# Patient Record
Sex: Female | Born: 1955 | Race: White | Hispanic: No | Marital: Single | Smoking: Never smoker
Health system: Southern US, Community
[De-identification: ages and names within clinical notes are randomized; demographics above are authoritative.]

## PROBLEM LIST (undated history)

## (undated) DIAGNOSIS — F419 Anxiety disorder, unspecified: Secondary | ICD-10-CM

## (undated) DIAGNOSIS — I447 Left bundle-branch block, unspecified: Secondary | ICD-10-CM

## (undated) DIAGNOSIS — I10 Essential (primary) hypertension: Secondary | ICD-10-CM

## (undated) DIAGNOSIS — K219 Gastro-esophageal reflux disease without esophagitis: Secondary | ICD-10-CM

## (undated) DIAGNOSIS — F32A Depression, unspecified: Secondary | ICD-10-CM

## (undated) DIAGNOSIS — C50919 Malignant neoplasm of unspecified site of unspecified female breast: Secondary | ICD-10-CM

## (undated) DIAGNOSIS — F329 Major depressive disorder, single episode, unspecified: Secondary | ICD-10-CM

## (undated) DIAGNOSIS — E039 Hypothyroidism, unspecified: Secondary | ICD-10-CM

## (undated) HISTORY — PX: MASTECTOMY: SHX3

## (undated) HISTORY — PX: SEPTOPLASTY: SUR1290

## (undated) HISTORY — PX: AUGMENTATION MAMMAPLASTY: SUR837

## (undated) HISTORY — PX: NASAL SINUS SURGERY: SHX719

## (undated) HISTORY — DX: Hypothyroidism, unspecified: E03.9

## (undated) HISTORY — DX: Essential (primary) hypertension: I10

---
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
## 1984-09-15 HISTORY — PX: VEIN SURGERY: SHX48

## 2004-12-20 ENCOUNTER — Ambulatory Visit: Payer: Self-pay | Admitting: Vascular Surgery

## 2004-12-26 ENCOUNTER — Ambulatory Visit: Payer: Self-pay | Admitting: Vascular Surgery

## 2005-10-27 ENCOUNTER — Ambulatory Visit: Payer: Self-pay | Admitting: Internal Medicine

## 2006-05-27 ENCOUNTER — Ambulatory Visit: Payer: Self-pay | Admitting: Internal Medicine

## 2006-07-10 ENCOUNTER — Ambulatory Visit: Payer: Self-pay | Admitting: Internal Medicine

## 2007-01-26 ENCOUNTER — Ambulatory Visit: Payer: Self-pay | Admitting: Internal Medicine

## 2007-04-06 ENCOUNTER — Ambulatory Visit: Payer: Self-pay | Admitting: Emergency Medicine

## 2007-04-14 ENCOUNTER — Ambulatory Visit: Payer: Self-pay | Admitting: Internal Medicine

## 2007-07-20 ENCOUNTER — Ambulatory Visit: Payer: Self-pay | Admitting: Internal Medicine

## 2007-10-27 ENCOUNTER — Ambulatory Visit: Payer: Self-pay | Admitting: Otolaryngology

## 2008-03-16 ENCOUNTER — Ambulatory Visit: Payer: Self-pay | Admitting: Internal Medicine

## 2008-05-23 ENCOUNTER — Ambulatory Visit: Payer: Self-pay | Admitting: Family Medicine

## 2008-06-02 ENCOUNTER — Ambulatory Visit: Payer: Self-pay

## 2009-05-16 ENCOUNTER — Ambulatory Visit: Payer: Self-pay | Admitting: Internal Medicine

## 2009-05-25 ENCOUNTER — Other Ambulatory Visit: Payer: Self-pay | Admitting: Internal Medicine

## 2010-04-10 ENCOUNTER — Ambulatory Visit: Payer: Self-pay | Admitting: Otolaryngology

## 2010-04-18 ENCOUNTER — Ambulatory Visit: Payer: Self-pay | Admitting: Otolaryngology

## 2010-08-22 ENCOUNTER — Encounter: Payer: Self-pay | Admitting: General Practice

## 2010-08-22 ENCOUNTER — Emergency Department: Payer: Self-pay | Admitting: Emergency Medicine

## 2010-08-23 ENCOUNTER — Ambulatory Visit: Payer: Self-pay | Admitting: Physician Assistant

## 2010-09-15 ENCOUNTER — Encounter: Payer: Self-pay | Admitting: General Practice

## 2010-10-14 ENCOUNTER — Ambulatory Visit: Payer: Self-pay | Admitting: General Practice

## 2010-10-18 ENCOUNTER — Other Ambulatory Visit: Payer: Self-pay | Admitting: Internal Medicine

## 2011-07-24 ENCOUNTER — Encounter: Payer: Self-pay | Admitting: Orthopedic Surgery

## 2011-08-16 ENCOUNTER — Encounter: Payer: Self-pay | Admitting: Orthopedic Surgery

## 2011-09-16 HISTORY — PX: COLONOSCOPY: SHX174

## 2011-09-17 ENCOUNTER — Ambulatory Visit: Payer: Self-pay | Admitting: Internal Medicine

## 2011-12-08 ENCOUNTER — Other Ambulatory Visit: Payer: Self-pay | Admitting: Internal Medicine

## 2011-12-08 LAB — LIPASE, BLOOD: Lipase: 413 U/L — ABNORMAL HIGH (ref 73–393)

## 2011-12-08 LAB — TSH: Thyroid Stimulating Horm: 7.91 u[IU]/mL — ABNORMAL HIGH

## 2011-12-26 ENCOUNTER — Ambulatory Visit: Payer: Self-pay | Admitting: Unknown Physician Specialty

## 2011-12-31 ENCOUNTER — Other Ambulatory Visit: Payer: Self-pay | Admitting: Unknown Physician Specialty

## 2012-08-04 ENCOUNTER — Ambulatory Visit: Payer: Self-pay | Admitting: General Practice

## 2013-02-16 ENCOUNTER — Ambulatory Visit: Payer: Self-pay | Admitting: Physician Assistant

## 2013-04-29 ENCOUNTER — Ambulatory Visit: Payer: Self-pay | Admitting: General Practice

## 2013-10-07 ENCOUNTER — Ambulatory Visit (INDEPENDENT_AMBULATORY_CARE_PROVIDER_SITE_OTHER): Payer: 59

## 2013-10-07 ENCOUNTER — Ambulatory Visit (INDEPENDENT_AMBULATORY_CARE_PROVIDER_SITE_OTHER): Payer: 59 | Admitting: Podiatry

## 2013-10-07 ENCOUNTER — Encounter: Payer: Self-pay | Admitting: Podiatry

## 2013-10-07 VITALS — BP 91/60 | HR 61 | Resp 16 | Ht 68.0 in | Wt 180.0 lb

## 2013-10-07 DIAGNOSIS — M2020 Hallux rigidus, unspecified foot: Secondary | ICD-10-CM

## 2013-10-07 DIAGNOSIS — M722 Plantar fascial fibromatosis: Secondary | ICD-10-CM

## 2013-10-07 MED ORDER — TRIAMCINOLONE ACETONIDE 10 MG/ML IJ SUSP
10.0000 mg | Freq: Once | INTRAMUSCULAR | Status: AC
  Administered 2013-10-07: 10 mg

## 2013-10-07 MED ORDER — DICLOFENAC SODIUM 75 MG PO TBEC: 75.0000 mg | DELAYED_RELEASE_TABLET | Freq: Two times a day (BID) | ORAL | Status: DC

## 2013-10-07 NOTE — Progress Notes (Signed)
   Subjective:    Patient ID: Margaret Farmer, female    DOB: 1955/12/29, 58 y.o.   MRN: [id number]  HPI Comments: Having severe heel pain in the left heel also numbness in the lateral side of foot to the ball of foot, cramps in the foot , pain that goes up to the back of the calf, its been going on for weeks, saw orthapedic and he put me on a anti inflammatory, it has not helped   Foot Pain      Review of Systems  All other systems reviewed and are negative.       Objective:   Physical Exam        Assessment & Plan:

## 2013-10-07 NOTE — Patient Instructions (Signed)
Plantar Fasciitis (Heel Spur Syndrome) with Rehab The plantar fascia is a fibrous, ligament-like, soft-tissue structure that spans the bottom of the foot. Plantar fasciitis is a condition that causes pain in the foot due to inflammation of the tissue. SYMPTOMS   Pain and tenderness on the underneath side of the foot.  Pain that worsens with standing or walking. CAUSES  Plantar fasciitis is caused by irritation and injury to the plantar fascia on the underneath side of the foot. Common mechanisms of injury include:  Direct trauma to bottom of the foot.  Damage to a small nerve that runs under the foot where the main fascia attaches to the heel bone.  Stress placed on the plantar fascia due to bone spurs. RISK INCREASES WITH:   Activities that place stress on the plantar fascia (running, jumping, pivoting, or cutting).  Poor strength and flexibility.  Improperly fitted shoes.  Tight calf muscles.  Flat feet.  Failure to warm-up properly before activity.  Obesity. PREVENTION  Warm up and stretch properly before activity.  Allow for adequate recovery between workouts.  Maintain physical fitness:  Strength, flexibility, and endurance.  Cardiovascular fitness.  Maintain a health body weight.  Avoid stress on the plantar fascia.  Wear properly fitted shoes, including arch supports for individuals who have flat feet. PROGNOSIS  If treated properly, then the symptoms of plantar fasciitis usually resolve without surgery. However, occasionally surgery is necessary. RELATED COMPLICATIONS   Recurrent symptoms that may result in a chronic condition.  Problems of the lower back that are caused by compensating for the injury, such as limping.  Pain or weakness of the foot during push-off following surgery.  Chronic inflammation, scarring, and partial or complete fascia tear, occurring more often from repeated injections. TREATMENT  Treatment initially involves the use of  ice and medication to help reduce pain and inflammation. The use of strengthening and stretching exercises may help reduce pain with activity, especially stretches of the Achilles tendon. These exercises may be performed at home or with a therapist. Your caregiver may recommend that you use heel cups of arch supports to help reduce stress on the plantar fascia. Occasionally, corticosteroid injections are given to reduce inflammation. If symptoms persist for greater than 6 months despite non-surgical (conservative), then surgery may be recommended.  MEDICATION   If pain medication is necessary, then nonsteroidal anti-inflammatory medications, such as aspirin and ibuprofen, or other minor pain relievers, such as acetaminophen, are often recommended.  Do not take pain medication within 7 days before surgery.  Prescription pain relievers may be given if deemed necessary by your caregiver. Use only as directed and only as much as you need.  Corticosteroid injections may be given by your caregiver. These injections should be reserved for the most serious cases, because they may only be given a certain number of times. HEAT AND COLD  Cold treatment (icing) relieves pain and reduces inflammation. Cold treatment should be applied for 10 to 15 minutes every 2 to 3 hours for inflammation and pain and immediately after any activity that aggravates your symptoms. Use ice packs or massage the area with a piece of ice (ice massage).  Heat treatment may be used prior to performing the stretching and strengthening activities prescribed by your caregiver, physical therapist, or athletic trainer. Use a heat pack or soak the injury in warm water. SEEK IMMEDIATE MEDICAL CARE IF:  Treatment seems to offer no benefit, or the condition worsens.  Any medications produce adverse side effects. EXERCISES RANGE   OF MOTION (ROM) AND STRETCHING EXERCISES - Plantar Fasciitis (Heel Spur Syndrome) These exercises may help you  when beginning to rehabilitate your injury. Your symptoms may resolve with or without further involvement from your physician, physical therapist or athletic trainer. While completing these exercises, remember:   Restoring tissue flexibility helps normal motion to return to the joints. This allows healthier, less painful movement and activity.  An effective stretch should be held for at least 30 seconds.  A stretch should never be painful. You should only feel a gentle lengthening or release in the stretched tissue. RANGE OF MOTION - Toe Extension, Flexion  Sit with your right / left leg crossed over your opposite knee.  Grasp your toes and gently pull them back toward the top of your foot. You should feel a stretch on the bottom of your toes and/or foot.  Hold this stretch for __________ seconds.  Now, gently pull your toes toward the bottom of your foot. You should feel a stretch on the top of your toes and or foot.  Hold this stretch for __________ seconds. Repeat __________ times. Complete this stretch __________ times per day.  RANGE OF MOTION - Ankle Dorsiflexion, Active Assisted  Remove shoes and sit on a chair that is preferably not on a carpeted surface.  Place right / left foot under knee. Extend your opposite leg for support.  Keeping your heel down, slide your right / left foot back toward the chair until you feel a stretch at your ankle or calf. If you do not feel a stretch, slide your bottom forward to the edge of the chair, while still keeping your heel down.  Hold this stretch for __________ seconds. Repeat __________ times. Complete this stretch __________ times per day.  STRETCH  Gastroc, Standing  Place hands on wall.  Extend right / left leg, keeping the front knee somewhat bent.  Slightly point your toes inward on your back foot.  Keeping your right / left heel on the floor and your knee straight, shift your weight toward the wall, not allowing your back to  arch.  You should feel a gentle stretch in the right / left calf. Hold this position for __________ seconds. Repeat __________ times. Complete this stretch __________ times per day. STRETCH  Soleus, Standing  Place hands on wall.  Extend right / left leg, keeping the other knee somewhat bent.  Slightly point your toes inward on your back foot.  Keep your right / left heel on the floor, bend your back knee, and slightly shift your weight over the back leg so that you feel a gentle stretch deep in your back calf.  Hold this position for __________ seconds. Repeat __________ times. Complete this stretch __________ times per day. STRETCH  Gastrocsoleus, Standing  Note: This exercise can place a lot of stress on your foot and ankle. Please complete this exercise only if specifically instructed by your caregiver.   Place the ball of your right / left foot on a step, keeping your other foot firmly on the same step.  Hold on to the wall or a rail for balance.  Slowly lift your other foot, allowing your body weight to press your heel down over the edge of the step.  You should feel a stretch in your right / left calf.  Hold this position for __________ seconds.  Repeat this exercise with a slight bend in your right / left knee. Repeat __________ times. Complete this stretch __________ times per day.    STRENGTHENING EXERCISES - Plantar Fasciitis (Heel Spur Syndrome)  These exercises may help you when beginning to rehabilitate your injury. They may resolve your symptoms with or without further involvement from your physician, physical therapist or athletic trainer. While completing these exercises, remember:   Muscles can gain both the endurance and the strength needed for everyday activities through controlled exercises.  Complete these exercises as instructed by your physician, physical therapist or athletic trainer. Progress the resistance and repetitions only as guided. STRENGTH - Towel  Curls  Sit in a chair positioned on a non-carpeted surface.  Place your foot on a towel, keeping your heel on the floor.  Pull the towel toward your heel by only curling your toes. Keep your heel on the floor.  If instructed by your physician, physical therapist or athletic trainer, add ____________________ at the end of the towel. Repeat __________ times. Complete this exercise __________ times per day. STRENGTH - Ankle Inversion  Secure one end of a rubber exercise band/tubing to a fixed object (table, pole). Loop the other end around your foot just before your toes.  Place your fists between your knees. This will focus your strengthening at your ankle.  Slowly, pull your big toe up and in, making sure the band/tubing is positioned to resist the entire motion.  Hold this position for __________ seconds.  Have your muscles resist the band/tubing as it slowly pulls your foot back to the starting position. Repeat __________ times. Complete this exercises __________ times per day.  Document Released: 09/01/2005 Document Revised: 11/24/2011 Document Reviewed: 12/14/2008 [employer] Patient Information 2014 [employer]. Plantar Fasciitis Plantar fasciitis is a common condition that causes foot pain. It is soreness (inflammation) of the band of tough fibrous tissue on the bottom of the foot that runs from the heel bone (calcaneus) to the ball of the foot. The cause of this soreness may be from excessive standing, poor fitting shoes, running on hard surfaces, being overweight, having an abnormal walk, or overuse (this is common in runners) of the painful foot or feet. It is also common in aerobic exercise dancers and ballet dancers. SYMPTOMS  Most people with plantar fasciitis complain of:  Severe pain in the morning on the bottom of their foot especially when taking the first steps out of bed. This pain recedes after a few minutes of walking.  Severe pain is experienced also during walking  following a long period of inactivity.  Pain is worse when walking barefoot or up stairs DIAGNOSIS   Your caregiver will diagnose this condition by examining and feeling your foot.  Special tests such as X-rays of your foot, are usually not needed. PREVENTION   Consult a sports medicine professional before beginning a new exercise program.  Walking programs offer a good workout. With walking there is a lower chance of overuse injuries common to runners. There is less impact and less jarring of the joints.  Begin all new exercise programs slowly. If problems or pain develop, decrease the amount of time or distance until you are at a comfortable level.  Wear good shoes and replace them regularly.  Stretch your foot and the heel cords at the back of the ankle (Achilles tendon) both before and after exercise.  Run or exercise on even surfaces that are not hard. For example, asphalt is better than pavement.  Do not run barefoot on hard surfaces.  If using a treadmill, vary the incline.  Do not continue to workout if you have foot or joint   problems. Seek professional help if they do not improve. HOME CARE INSTRUCTIONS   Avoid activities that cause you pain until you recover.  Use ice or cold packs on the problem or painful areas after working out.  Only take over-the-counter or prescription medicines for pain, discomfort, or fever as directed by your caregiver.  Soft shoe inserts or athletic shoes with air or gel sole cushions may be helpful.  If problems continue or become more severe, consult a sports medicine caregiver or your own health care provider. Cortisone is a potent anti-inflammatory medication that may be injected into the painful area. You can discuss this treatment with your caregiver. MAKE SURE YOU:   Understand these instructions.  Will watch your condition.  Will get help right away if you are not doing well or get worse. Document Released: 05/27/2001 Document  Revised: 11/24/2011 Document Reviewed: 07/26/2008 [employer] Patient Information 2014 [employer].  

## 2013-10-07 NOTE — Progress Notes (Signed)
Subjective:     Patient ID: Margaret Farmer, female   DOB: [birth year], 58 y.o.   MRN: [id number]  Foot Pain   patient presents stating she's been having pain in her heel for several months it has worsened over the last month and it now hurts in the arch the back of the leg and the forefoot   Review of Systems  All other systems reviewed and are negative.       Objective:   Physical Exam  Nursing note and vitals reviewed. Constitutional: She is oriented to person, place, and time.  Cardiovascular: Intact distal pulses.   Musculoskeletal: Normal range of motion.  Neurological: She is oriented to person, place, and time.  Skin: Skin is warm.   neurovascular status intact with no equinus condition noted and muscle strength adequate. Patient is found to have severe discomfort on the plantar heel left at the insertional point of the tendon into the calcaneus with inflammation and fluid around the medial pain     Assessment:     Severe plantar fasciitis with inflammation around the insertion into the calcaneus with compensation in other areas    Plan:     H&P and x-ray reviewed with patient and injected the left plantar fascia 3 mg Kenalog 5 of Xylocaine Marcaine mixture and dispensed fascially brace. placed on Voltaren 75 mg twice a day and discussed long-term orthotics for this patient

## 2013-11-01 ENCOUNTER — Ambulatory Visit (INDEPENDENT_AMBULATORY_CARE_PROVIDER_SITE_OTHER): Payer: 59 | Admitting: Podiatry

## 2013-11-01 ENCOUNTER — Encounter: Payer: Self-pay | Admitting: Podiatry

## 2013-11-01 VITALS — BP 131/83 | HR 95 | Resp 16

## 2013-11-01 DIAGNOSIS — M722 Plantar fascial fibromatosis: Secondary | ICD-10-CM

## 2013-11-01 DIAGNOSIS — M21619 Bunion of unspecified foot: Secondary | ICD-10-CM

## 2013-11-01 NOTE — Progress Notes (Signed)
Left heel maybe 85% better

## 2013-11-01 NOTE — Progress Notes (Signed)
Subjective:     Patient ID: Margaret Farmer, female   DOB: [birth year], 58 y.o.   MRN: [id number]  HPI patient states her left heel is quite a bit better but hurts after she has worked for approximately 6 hours   Review of Systems     Objective:   Physical Exam Neurovascular status unchanged well oriented x3 with mild discomfort to pressure left heel at the insertion of the tendon into the calcaneus    Assessment:     Plan her fasciitis heel region left which is improving but still sore with activity and weightbearing    Plan:     Advised this patient on continued stretching exercises and scanned for custom orthotics to reduce stress against the feet reappoint when orthotics returned

## 2013-11-04 ENCOUNTER — Ambulatory Visit: Payer: Self-pay | Admitting: Internal Medicine

## 2013-11-16 ENCOUNTER — Encounter: Payer: Self-pay | Admitting: *Deleted

## 2013-11-16 NOTE — Progress Notes (Signed)
Sent postcard to let pt know her orthotics are here.

## 2013-11-22 ENCOUNTER — Ambulatory Visit (INDEPENDENT_AMBULATORY_CARE_PROVIDER_SITE_OTHER): Payer: 59 | Admitting: Podiatry

## 2013-11-22 VITALS — BP 125/96 | HR 103 | Resp 16 | Ht 68.0 in | Wt 180.0 lb

## 2013-11-22 DIAGNOSIS — M722 Plantar fascial fibromatosis: Secondary | ICD-10-CM

## 2013-11-22 NOTE — Patient Instructions (Signed)

## 2013-11-22 NOTE — Progress Notes (Signed)
Subjective:     Patient ID: Margaret Farmer, female   DOB: 06/02/1956, 58 y.o.   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 Reappoint to recheck again in 4 weeks

## 2013-12-16 ENCOUNTER — Ambulatory Visit: Payer: Self-pay | Admitting: Internal Medicine

## 2013-12-20 ENCOUNTER — Ambulatory Visit: Payer: 59 | Admitting: Podiatry

## 2014-04-12 ENCOUNTER — Ambulatory Visit: Payer: Self-pay | Admitting: Surgery

## 2014-12-20 ENCOUNTER — Encounter: Payer: Self-pay | Admitting: *Deleted

## 2015-10-15 DIAGNOSIS — I10 Essential (primary) hypertension: Secondary | ICD-10-CM | POA: Diagnosis not present

## 2015-10-15 DIAGNOSIS — J3089 Other allergic rhinitis: Secondary | ICD-10-CM | POA: Diagnosis not present

## 2015-10-15 DIAGNOSIS — Z87898 Personal history of other specified conditions: Secondary | ICD-10-CM | POA: Diagnosis not present

## 2015-10-15 DIAGNOSIS — E034 Atrophy of thyroid (acquired): Secondary | ICD-10-CM | POA: Diagnosis not present

## 2015-10-22 DIAGNOSIS — M810 Age-related osteoporosis without current pathological fracture: Secondary | ICD-10-CM | POA: Diagnosis not present

## 2015-11-05 DIAGNOSIS — Z1231 Encounter for screening mammogram for malignant neoplasm of breast: Secondary | ICD-10-CM | POA: Diagnosis not present

## 2015-11-05 DIAGNOSIS — Z79890 Hormone replacement therapy (postmenopausal): Secondary | ICD-10-CM | POA: Diagnosis not present

## 2015-11-15 DIAGNOSIS — E039 Hypothyroidism, unspecified: Secondary | ICD-10-CM | POA: Diagnosis not present

## 2015-11-15 DIAGNOSIS — M8580 Other specified disorders of bone density and structure, unspecified site: Secondary | ICD-10-CM | POA: Diagnosis not present

## 2015-11-26 DIAGNOSIS — B9689 Other specified bacterial agents as the cause of diseases classified elsewhere: Secondary | ICD-10-CM | POA: Diagnosis not present

## 2015-11-26 DIAGNOSIS — J019 Acute sinusitis, unspecified: Secondary | ICD-10-CM | POA: Diagnosis not present

## 2015-11-29 DIAGNOSIS — R05 Cough: Secondary | ICD-10-CM | POA: Diagnosis not present

## 2015-11-29 DIAGNOSIS — J019 Acute sinusitis, unspecified: Secondary | ICD-10-CM | POA: Diagnosis not present

## 2016-01-03 DIAGNOSIS — E039 Hypothyroidism, unspecified: Secondary | ICD-10-CM | POA: Diagnosis not present

## 2016-01-03 DIAGNOSIS — M8580 Other specified disorders of bone density and structure, unspecified site: Secondary | ICD-10-CM | POA: Diagnosis not present

## 2016-01-31 DIAGNOSIS — L570 Actinic keratosis: Secondary | ICD-10-CM | POA: Diagnosis not present

## 2016-01-31 DIAGNOSIS — D2361 Other benign neoplasm of skin of right upper limb, including shoulder: Secondary | ICD-10-CM | POA: Diagnosis not present

## 2016-02-15 ENCOUNTER — Encounter: Payer: Self-pay | Admitting: *Deleted

## 2016-02-20 ENCOUNTER — Encounter: Payer: Self-pay | Admitting: *Deleted

## 2016-02-22 DIAGNOSIS — M25572 Pain in left ankle and joints of left foot: Secondary | ICD-10-CM | POA: Diagnosis not present

## 2016-02-22 DIAGNOSIS — S93412A Sprain of calcaneofibular ligament of left ankle, initial encounter: Secondary | ICD-10-CM | POA: Diagnosis not present

## 2016-02-26 ENCOUNTER — Encounter: Payer: Self-pay | Admitting: General Surgery

## 2016-02-27 ENCOUNTER — Ambulatory Visit (INDEPENDENT_AMBULATORY_CARE_PROVIDER_SITE_OTHER): Payer: 59 | Admitting: General Surgery

## 2016-02-27 ENCOUNTER — Encounter: Payer: Self-pay | Admitting: General Surgery

## 2016-02-27 VITALS — BP 144/82 | HR 88 | Resp 14 | Ht 68.0 in | Wt 196.0 lb

## 2016-02-27 DIAGNOSIS — R2231 Localized swelling, mass and lump, right upper limb: Secondary | ICD-10-CM | POA: Diagnosis not present

## 2016-02-27 DIAGNOSIS — R2230 Localized swelling, mass and lump, unspecified upper limb: Secondary | ICD-10-CM | POA: Insufficient documentation

## 2016-02-27 DIAGNOSIS — D2361 Other benign neoplasm of skin of right upper limb, including shoulder: Secondary | ICD-10-CM | POA: Diagnosis not present

## 2016-02-27 NOTE — Progress Notes (Signed)
Patient ID: Margaret Farmer, female   DOB: May 15, 1956, 60 y.o.   MRN: [id number]  Chief Complaint  Patient presents with  . Other    right arm cyst    HPI Margaret Farmer is a 60 y.o. female here today for a evaluation for a right arm cyst. Patient states she noticed this area about two years . She states the area has got bigger and there is some pain.   I personally reviewed the patient's history. HPI  Past Medical History  Diagnosis Date  . Hypothyroid   . Hypertension     Past Surgical History  Procedure Laterality Date  . Vein surgery Left 1986  . Colonoscopy  2013    No family history on file.  Social History Social History  Substance Use Topics  . Smoking status: Never Smoker   . Smokeless tobacco: Never Used  . Alcohol Use: Yes     Comment: rare    No Known Allergies  Current Outpatient Prescriptions  Medication Sig Dispense Refill  . Conj Estrogens-Bazedoxifene (DUAVEE) 0.45-20 MG TABS Take by mouth.    . enalapril (VASOTEC) 10 MG tablet Take by mouth.    . metoprolol succinate (TOPROL-XL) 25 MG 24 hr tablet Take by mouth.    . Omega-3 Fatty Acids (FISH OIL) 1000 MG CAPS Take by mouth.    . SYNTHROID 150 MCG tablet   11   No current facility-administered medications for this visit.    Review of Systems Review of Systems  Blood pressure 144/82, pulse 88, resp. rate 14, height 5\' 8"  (1.727 m), weight 196 lb (88.905 kg).  Physical Exam Physical Exam  Constitutional: She is oriented to person, place, and time. She appears well-developed and well-nourished.  Eyes: Conjunctivae are normal. No scleral icterus.  Neck: Neck supple.  Cardiovascular: Normal rate, regular rhythm and normal heart sounds.   Pulmonary/Chest: Effort normal and breath sounds normal.  Abdominal: Soft. Bowel sounds are normal. There is no tenderness.  Lymphadenopathy:    She has no cervical adenopathy.  Neurological: She is alert and oriented to person, place, and time.  Skin:  Skin is warm and dry.        Assessment    Symptomatic cutaneous nodule right posterior lateral upper arm.    Plan    It was elected to excise the area for comfort. 10 mL of 0.5% Xylocaine with 0.25% Marcaine with 1-200,000 epinephrine was utilized well tolerated. The area was excised through a longitudinally oriented elliptical incision. A suture was placed at the 12:00 position (cephalad). The wound was closed with interrupted 4-0 Prolene sutures. Dry dressing with Telfa and Tegaderm applied.  The patient may have her sister who is a nurse remove her sutures in one week or she is well-healed return to the office to have this completed here.   Return in seven days nurse.  PCP:  Ramonita Lab  This information has been scribed by Gaspar Cola CMA.    Robert Bellow 02/27/2016, 12:19 PM

## 2016-02-27 NOTE — Patient Instructions (Signed)
Seven day follow up nurse.

## 2016-03-05 ENCOUNTER — Encounter: Payer: Self-pay | Admitting: General Surgery

## 2016-03-06 ENCOUNTER — Telehealth: Payer: Self-pay | Admitting: *Deleted

## 2016-03-06 ENCOUNTER — Ambulatory Visit (INDEPENDENT_AMBULATORY_CARE_PROVIDER_SITE_OTHER): Payer: 59 | Admitting: *Deleted

## 2016-03-06 DIAGNOSIS — R2231 Localized swelling, mass and lump, right upper limb: Secondary | ICD-10-CM

## 2016-03-06 NOTE — Telephone Encounter (Signed)
appointment made per [facility]

## 2016-03-06 NOTE — Telephone Encounter (Signed)
Pt needs nurse visit appointment today

## 2016-03-10 NOTE — Patient Instructions (Signed)
The patient is aware to call back for any questions or concerns.  

## 2016-03-10 NOTE — Progress Notes (Signed)
Patient ID: Margaret Farmer, female   DOB: September 22, 1955, 60 y.o.   MRN: [id number] Patient came in today for a wound check.  She had called to say the incision had opened up post suture removal. The wound is clean, with no signs of infection noted. Steri strips applied with benzoin by Dr Bary Castilla with gauze and tegaderm. Follow up as scheduled.

## 2016-04-03 DIAGNOSIS — M8580 Other specified disorders of bone density and structure, unspecified site: Secondary | ICD-10-CM | POA: Diagnosis not present

## 2016-04-03 DIAGNOSIS — E039 Hypothyroidism, unspecified: Secondary | ICD-10-CM | POA: Diagnosis not present

## 2016-04-14 DIAGNOSIS — J3089 Other allergic rhinitis: Secondary | ICD-10-CM | POA: Diagnosis not present

## 2016-04-14 DIAGNOSIS — I10 Essential (primary) hypertension: Secondary | ICD-10-CM | POA: Diagnosis not present

## 2016-04-14 DIAGNOSIS — E039 Hypothyroidism, unspecified: Secondary | ICD-10-CM | POA: Diagnosis not present

## 2016-04-21 DIAGNOSIS — J019 Acute sinusitis, unspecified: Secondary | ICD-10-CM | POA: Diagnosis not present

## 2016-06-04 ENCOUNTER — Encounter (INDEPENDENT_AMBULATORY_CARE_PROVIDER_SITE_OTHER): Payer: Self-pay

## 2016-07-04 ENCOUNTER — Other Ambulatory Visit (INDEPENDENT_AMBULATORY_CARE_PROVIDER_SITE_OTHER): Payer: Self-pay | Admitting: Vascular Surgery

## 2016-07-04 DIAGNOSIS — I871 Compression of vein: Secondary | ICD-10-CM

## 2016-07-07 ENCOUNTER — Ambulatory Visit (INDEPENDENT_AMBULATORY_CARE_PROVIDER_SITE_OTHER): Payer: 59

## 2016-07-07 ENCOUNTER — Encounter (INDEPENDENT_AMBULATORY_CARE_PROVIDER_SITE_OTHER): Payer: Self-pay | Admitting: Vascular Surgery

## 2016-07-07 ENCOUNTER — Ambulatory Visit (INDEPENDENT_AMBULATORY_CARE_PROVIDER_SITE_OTHER): Payer: 59 | Admitting: Vascular Surgery

## 2016-07-07 DIAGNOSIS — I871 Compression of vein: Secondary | ICD-10-CM

## 2016-07-07 DIAGNOSIS — I890 Lymphedema, not elsewhere classified: Secondary | ICD-10-CM

## 2016-07-07 DIAGNOSIS — I10 Essential (primary) hypertension: Secondary | ICD-10-CM | POA: Diagnosis not present

## 2016-07-07 DIAGNOSIS — M79605 Pain in left leg: Secondary | ICD-10-CM | POA: Diagnosis not present

## 2016-07-07 DIAGNOSIS — M79609 Pain in unspecified limb: Secondary | ICD-10-CM | POA: Insufficient documentation

## 2016-07-07 DIAGNOSIS — I872 Venous insufficiency (chronic) (peripheral): Secondary | ICD-10-CM

## 2016-07-07 NOTE — Progress Notes (Signed)
[facility] Admission History & Physical  MRN : [id number]  Margaret Farmer is a 60 y.o. (Feb 11, 1956) female who presents with chief complaint of  Chief Complaint  Patient presents with  . Re-evaluation    Ultrasound follow up  .  History of Present Illness: The patient returns for her followup for her left lower extremity swelling. 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 She has developed lymphedema with chronic pain and swelling in her leg. She reports using the lymphedema pump daily and wearing her compression stockings. These measures have helped her left lower extremity. The swelling persists although it is better than it was at her last visit.  The patient denies changes in claudication symptoms or new rest pain symptoms.   The patient's blood pressure has been stable and relatively well controlled. The patient denies amaurosis fugax or recent TIA symptoms. There are no recent neurological changes noted. The patient denies history of DVT, PE or superficial thrombophlebitis. The patient denies recent episodes of angina or shortness of breath.    Current Meds  Medication Sig  . acetaminophen-codeine (TYLENOL #4) 300-60 MG tablet Take by mouth.  . ALPRAZolam (XANAX) 0.25 MG tablet   . celecoxib (CELEBREX) 100 MG capsule Take by mouth.  . ciclesonide (OMNARIS) 50 MCG/ACT nasal spray Place into the nose.  Marland Kitchen Conj Estrogens-Bazedoxifene (DUAVEE) 0.45-20 MG TABS Take by mouth.  . cyclobenzaprine (FLEXERIL) 10 MG tablet Take 10 mg by mouth.  . enalapril (VASOTEC) 10 MG tablet Take by mouth.  . Eszopiclone 3 MG TABS   . losartan (COZAAR) 100 MG tablet Take 50 mg by mouth.  Marland Kitchen MAGNESIUM PO Take 150 mg by mouth.  . metoprolol succinate (TOPROL-XL) 25 MG 24 hr tablet Take by mouth.  . Omega-3 Fatty Acids (FISH OIL) 1000 MG CAPS Take by mouth.  . promethazine (PHENERGAN) 25 MG tablet   . spironolactone (ALDACTONE) 25 MG tablet   .  SYNTHROID 150 MCG tablet   . zolpidem (AMBIEN) 5 MG tablet Take 5 mg by mouth.    Past Medical History:  Diagnosis Date  . Hypertension   . Hypothyroid     Past Surgical History:  Procedure Laterality Date  . COLONOSCOPY  2013  . VEIN SURGERY Left 1986    Social History Social History  Substance Use Topics  . Smoking status: Never Smoker  . Smokeless tobacco: Never Used  . Alcohol use Yes     Comment: rare    Family History No family history of bleeding/clotting disorders, porphyria or autoimmune disease   No Known Allergies   REVIEW OF SYSTEMS (Negative unless checked)  Constitutional: [] Weight loss  [] Fever  [] Chills Cardiac: [] Chest pain   [] Chest pressure   [] Palpitations   [] Shortness of breath when laying flat   [] Shortness of breath with exertion. Vascular:  [] Pain in legs with walking   [] Pain in legs at rest  [] History of DVT   [] Phlebitis   [x] Swelling in legs   [] Varicose veins   [] Non-healing ulcers Pulmonary:   [] Uses home oxygen   [] Productive cough   [] Hemoptysis   [] Wheeze  [] COPD   [] Asthma Neurologic:  [] Dizziness   [] Seizures   [] History of stroke   [] History of TIA  [] Aphasia   [] Vissual changes   [] Weakness or numbness in arm   [] Weakness or numbness in leg Musculoskeletal:   [] Joint swelling   [] Joint pain   [] Low back pain Hematologic:  [] Easy bruising  [] Easy  bleeding   [] Hypercoagulable state   [] Anemic Gastrointestinal:  [] Diarrhea   [] Vomiting  [] Gastroesophageal reflux/heartburn   [] Difficulty swallowing. Genitourinary:  [] Chronic kidney disease   [] Difficult urination  [] Frequent urination   [] Blood in urine Skin:  [] Rashes   [] Ulcers  Psychological:  [] History of anxiety   []  History of major depression.  Physical Examination  Vitals:   07/07/16 0849  BP: 110/63  Pulse: 68  Resp: 16  Weight: 201 lb (91.2 kg)  Height: 5\' 8"  (1.727 m)   Body mass index is 30.56 kg/m. Gen: WD/WN, NAD Head: [place]/AT, No temporalis wasting.   Ear/Nose/Throat: Hearing grossly intact, nares w/o erythema or drainage, poor dentition Eyes: PER, EOMI, sclera nonicteric.  Neck: Supple, no masses.  No bruit or JVD.  Pulmonary:  Good air movement, clear to auscultation bilaterally, no use of accessory muscles.  Cardiac: RRR, normal S1, S2, no Murmurs. Vascular: left leg with 2+ edema patient wearing her compression with excellent control. Vessel Right Left  Radial Palpable Palpable  Ulnar Palpable Palpable  Brachial Palpable Palpable  Carotid Palpable Palpable  Femoral Palpable Palpable  Popliteal Palpable Palpable  PT Palpable Palpable  DP Palpable Palpable   Gastrointestinal: soft, non-distended. No guarding/no peritoneal signs.  Musculoskeletal: M/S 5/5 throughout.  No deformity or atrophy.  Neurologic: CN 2-12 intact. Pain and light touch intact in extremities.  Symmetrical.  Speech is fluent. Motor exam as listed above. Psychiatric: Judgment intact, Mood & affect appropriate for pt's clinical situation. Dermatologic: No rashes or ulcers noted.  No changes consistent with cellulitis. Lymph : No Cervical lymphadenopathy, no lichenification or skin changes of chronic lymphedema.  CBC No results found for: WBC, HGB, HCT, MCV, PLT  BMET No results found for: NA, K, CL, CO2, GLUCOSE, BUN, CREATININE, CALCIUM, GFRNONAA, GFRAA CrCl cannot be calculated (No order found.).  COAG No results found for: INR, PROTIME  Radiology No results found.   Assessment/Plan 1.  Lymphedema (457.1  I89.0) Impression: Doing better. Continue to use her lymphedema pump. Plan to recheck her in 12 months or sooner if problems develop in the interim.  No surgery or intervention at this point in time.  I have reviewed my discussion with the patient regarding swelling and why it causes symptoms. Patient will continue wearing graduated compression stockings class 1 (20-30 mmHg) on a daily basis a prescription was given. The patient will beginning  wearing the stockings first thing in the morning and removing them in the evening. The patient is instructed specifically not to sleep in the stockings.  In addition, behavioral modification including elevation during the day will be continued. This will include frequent elevation, use of over the counter pain medications and exercise such as walking.  I have reviewed systemic causes for chronic edema such as liver, kidney and cardiac etiologies and there does not appear to be any significant changes in these organ systems over the past year. The patient is under the impression that these organ systems are all stable and unchanged.  The patient will continue aggressive use of the lymph pump. This will continue to improve the edema control and prevent sequela such as ulcers and infections  The patient will follow-up with me on a PRN  2.  May-Thurner syndrome (459.2  I87.1) Impression: Status post previous intervention. Doing well with no recurrent stenosis identified on most recent ultrasounds.  3.  Swelling, limb (729.81  M79.89) Impression: Stable. Better than it was at last visit slightly. No recurrent cellulitis.  4.  PAIN  IN LIMB (729.5  M79.609)  5.  Venous insufficiency (459.81  I87.2)  6.  Hypertension, essential, benign (401.1  I10)   Hortencia Pilar, MD  07/07/2016 9:09 AM

## 2016-08-12 DIAGNOSIS — M79672 Pain in left foot: Secondary | ICD-10-CM | POA: Diagnosis not present

## 2016-08-12 DIAGNOSIS — M25475 Effusion, left foot: Secondary | ICD-10-CM | POA: Diagnosis not present

## 2016-08-12 DIAGNOSIS — I871 Compression of vein: Secondary | ICD-10-CM | POA: Diagnosis not present

## 2016-08-12 DIAGNOSIS — I10 Essential (primary) hypertension: Secondary | ICD-10-CM | POA: Diagnosis not present

## 2016-08-12 DIAGNOSIS — M19072 Primary osteoarthritis, left ankle and foot: Secondary | ICD-10-CM | POA: Diagnosis not present

## 2016-08-13 ENCOUNTER — Ambulatory Visit
Admission: RE | Admit: 2016-08-13 | Discharge: 2016-08-13 | Disposition: A | Discharge status: Home / Self Care | Payer: 59 | Source: Ambulatory Visit | Attending: Internal Medicine | Admitting: Internal Medicine

## 2016-08-13 ENCOUNTER — Other Ambulatory Visit: Payer: Self-pay | Admitting: Internal Medicine

## 2016-08-13 DIAGNOSIS — M79672 Pain in left foot: Secondary | ICD-10-CM

## 2016-08-13 DIAGNOSIS — M25475 Effusion, left foot: Secondary | ICD-10-CM | POA: Diagnosis not present

## 2016-08-18 ENCOUNTER — Other Ambulatory Visit (INDEPENDENT_AMBULATORY_CARE_PROVIDER_SITE_OTHER): Payer: Self-pay | Admitting: Vascular Surgery

## 2016-08-19 DIAGNOSIS — M79672 Pain in left foot: Secondary | ICD-10-CM | POA: Diagnosis not present

## 2016-08-19 DIAGNOSIS — M84375A Stress fracture, left foot, initial encounter for fracture: Secondary | ICD-10-CM | POA: Diagnosis not present

## 2016-09-02 DIAGNOSIS — S96912D Strain of unspecified muscle and tendon at ankle and foot level, left foot, subsequent encounter: Secondary | ICD-10-CM | POA: Diagnosis not present

## 2016-09-02 DIAGNOSIS — M79672 Pain in left foot: Secondary | ICD-10-CM | POA: Diagnosis not present

## 2016-09-09 DIAGNOSIS — E034 Atrophy of thyroid (acquired): Secondary | ICD-10-CM | POA: Diagnosis not present

## 2016-10-20 DIAGNOSIS — E039 Hypothyroidism, unspecified: Secondary | ICD-10-CM | POA: Diagnosis not present

## 2016-10-20 DIAGNOSIS — I871 Compression of vein: Secondary | ICD-10-CM | POA: Diagnosis not present

## 2016-10-20 DIAGNOSIS — I10 Essential (primary) hypertension: Secondary | ICD-10-CM | POA: Diagnosis not present

## 2016-10-20 DIAGNOSIS — Z0001 Encounter for general adult medical examination with abnormal findings: Secondary | ICD-10-CM | POA: Diagnosis not present

## 2016-10-22 ENCOUNTER — Other Ambulatory Visit (INDEPENDENT_AMBULATORY_CARE_PROVIDER_SITE_OTHER): Payer: Self-pay | Admitting: Vascular Surgery

## 2016-10-30 ENCOUNTER — Other Ambulatory Visit: Payer: Self-pay | Admitting: *Deleted

## 2016-10-30 ENCOUNTER — Inpatient Hospital Stay
Admission: RE | Admit: 2016-10-30 | Discharge: 2016-10-30 | Disposition: A | Discharge status: Home / Self Care | Payer: Self-pay | Source: Ambulatory Visit | Attending: *Deleted | Admitting: *Deleted

## 2016-10-30 DIAGNOSIS — Z9289 Personal history of other medical treatment: Secondary | ICD-10-CM

## 2017-01-16 ENCOUNTER — Other Ambulatory Visit (INDEPENDENT_AMBULATORY_CARE_PROVIDER_SITE_OTHER): Payer: Self-pay | Admitting: Vascular Surgery

## 2017-01-16 DIAGNOSIS — I10 Essential (primary) hypertension: Secondary | ICD-10-CM | POA: Diagnosis not present

## 2017-01-16 DIAGNOSIS — J3089 Other allergic rhinitis: Secondary | ICD-10-CM | POA: Diagnosis not present

## 2017-02-23 ENCOUNTER — Other Ambulatory Visit (INDEPENDENT_AMBULATORY_CARE_PROVIDER_SITE_OTHER): Payer: Self-pay | Admitting: Vascular Surgery

## 2017-03-05 DIAGNOSIS — H524 Presbyopia: Secondary | ICD-10-CM | POA: Diagnosis not present

## 2017-03-05 DIAGNOSIS — H2513 Age-related nuclear cataract, bilateral: Secondary | ICD-10-CM | POA: Diagnosis not present

## 2017-04-17 DIAGNOSIS — E039 Hypothyroidism, unspecified: Secondary | ICD-10-CM | POA: Diagnosis not present

## 2017-04-17 DIAGNOSIS — I10 Essential (primary) hypertension: Secondary | ICD-10-CM | POA: Diagnosis not present

## 2017-04-20 DIAGNOSIS — I871 Compression of vein: Secondary | ICD-10-CM | POA: Diagnosis not present

## 2017-04-20 DIAGNOSIS — Z87898 Personal history of other specified conditions: Secondary | ICD-10-CM | POA: Diagnosis not present

## 2017-04-20 DIAGNOSIS — I10 Essential (primary) hypertension: Secondary | ICD-10-CM | POA: Diagnosis not present

## 2017-04-20 DIAGNOSIS — E039 Hypothyroidism, unspecified: Secondary | ICD-10-CM | POA: Diagnosis not present

## 2017-05-01 ENCOUNTER — Other Ambulatory Visit: Payer: Self-pay | Admitting: Internal Medicine

## 2017-05-01 DIAGNOSIS — Z1231 Encounter for screening mammogram for malignant neoplasm of breast: Secondary | ICD-10-CM

## 2017-05-07 ENCOUNTER — Ambulatory Visit
Admission: RE | Admit: 2017-05-07 | Discharge: 2017-05-07 | Disposition: A | Discharge status: Home / Self Care | Payer: 59 | Source: Ambulatory Visit | Attending: Internal Medicine | Admitting: Internal Medicine

## 2017-05-07 ENCOUNTER — Other Ambulatory Visit (INDEPENDENT_AMBULATORY_CARE_PROVIDER_SITE_OTHER): Payer: Self-pay | Admitting: Vascular Surgery

## 2017-05-07 DIAGNOSIS — N951 Menopausal and female climacteric states: Secondary | ICD-10-CM | POA: Diagnosis not present

## 2017-05-07 DIAGNOSIS — Z1231 Encounter for screening mammogram for malignant neoplasm of breast: Secondary | ICD-10-CM | POA: Diagnosis not present

## 2017-05-11 DIAGNOSIS — N898 Other specified noninflammatory disorders of vagina: Secondary | ICD-10-CM | POA: Diagnosis not present

## 2017-05-11 DIAGNOSIS — R232 Flushing: Secondary | ICD-10-CM | POA: Diagnosis not present

## 2017-05-11 DIAGNOSIS — G479 Sleep disorder, unspecified: Secondary | ICD-10-CM | POA: Diagnosis not present

## 2017-05-11 DIAGNOSIS — M2550 Pain in unspecified joint: Secondary | ICD-10-CM | POA: Diagnosis not present

## 2017-06-12 DIAGNOSIS — R232 Flushing: Secondary | ICD-10-CM | POA: Diagnosis not present

## 2017-06-23 DIAGNOSIS — M2550 Pain in unspecified joint: Secondary | ICD-10-CM | POA: Diagnosis not present

## 2017-06-23 DIAGNOSIS — E039 Hypothyroidism, unspecified: Secondary | ICD-10-CM | POA: Diagnosis not present

## 2017-06-23 DIAGNOSIS — R232 Flushing: Secondary | ICD-10-CM | POA: Diagnosis not present

## 2017-06-23 DIAGNOSIS — N898 Other specified noninflammatory disorders of vagina: Secondary | ICD-10-CM | POA: Diagnosis not present

## 2017-06-23 DIAGNOSIS — G479 Sleep disorder, unspecified: Secondary | ICD-10-CM | POA: Diagnosis not present

## 2017-07-15 DIAGNOSIS — I10 Essential (primary) hypertension: Secondary | ICD-10-CM | POA: Diagnosis not present

## 2017-07-15 DIAGNOSIS — R1032 Left lower quadrant pain: Secondary | ICD-10-CM | POA: Diagnosis not present

## 2017-07-15 DIAGNOSIS — R102 Pelvic and perineal pain: Secondary | ICD-10-CM | POA: Diagnosis not present

## 2017-07-30 DIAGNOSIS — R232 Flushing: Secondary | ICD-10-CM | POA: Diagnosis not present

## 2017-07-30 DIAGNOSIS — N898 Other specified noninflammatory disorders of vagina: Secondary | ICD-10-CM | POA: Diagnosis not present

## 2017-07-30 DIAGNOSIS — M2550 Pain in unspecified joint: Secondary | ICD-10-CM | POA: Diagnosis not present

## 2017-07-30 DIAGNOSIS — G479 Sleep disorder, unspecified: Secondary | ICD-10-CM | POA: Diagnosis not present

## 2017-08-14 DIAGNOSIS — R232 Flushing: Secondary | ICD-10-CM | POA: Diagnosis not present

## 2017-08-14 DIAGNOSIS — G479 Sleep disorder, unspecified: Secondary | ICD-10-CM | POA: Diagnosis not present

## 2017-08-14 DIAGNOSIS — N898 Other specified noninflammatory disorders of vagina: Secondary | ICD-10-CM | POA: Diagnosis not present

## 2017-08-14 DIAGNOSIS — E039 Hypothyroidism, unspecified: Secondary | ICD-10-CM | POA: Diagnosis not present

## 2017-08-14 DIAGNOSIS — M2550 Pain in unspecified joint: Secondary | ICD-10-CM | POA: Diagnosis not present

## 2017-08-26 ENCOUNTER — Telehealth: Payer: 59 | Admitting: Family

## 2017-08-26 DIAGNOSIS — J069 Acute upper respiratory infection, unspecified: Secondary | ICD-10-CM | POA: Diagnosis not present

## 2017-08-26 MED ORDER — BENZONATATE 100 MG PO CAPS: 100.0000 mg | ORAL_CAPSULE | Freq: Three times a day (TID) | ORAL | 0 refills | Status: DC | PRN

## 2017-08-26 NOTE — Progress Notes (Signed)
We are sorry that you are not feeling well.  Here is how we plan to help!  Based on your presentation I believe you most likely have A cough due to a virus.  This is called viral bronchitis and is best treated by rest, plenty of fluids and control of the cough.  You may use Ibuprofen or Tylenol as directed to help your symptoms.     In addition you may use A non-prescription cough medication called Robitussin DAC. Take 2 teaspoons every 8 hours or Delsym: take 2 teaspoons every 12 hours., A non-prescription cough medication called Mucinex DM: take 2 tablets every 12 hours. and A prescription cough medication called Tessalon Perles 100mg. You may take 1-2 capsules every 8 hours as needed for your cough.  Providers prescribe antibiotics to treat infections caused by bacteria. Antibiotics are very powerful in treating bacterial infections when they are used properly. To maintain their effectiveness, they should be used only when necessary. Overuse of antibiotics has resulted in the development of superbugs that are resistant to treatment!    After careful review of your answers, I would not recommend an antibiotic for your condition.  Antibiotics are not effective against viruses and therefore should not be used to treat them. Common examples of infections caused by viruses include colds and flu   From your responses in the eVisit questionnaire you describe inflammation in the upper respiratory tract which is causing a significant cough.  This is commonly called Bronchitis and has four common causes:    Allergies  Viral Infections  Acid Reflux  Bacterial Infection Allergies, viruses and acid reflux are treated by controlling symptoms or eliminating the cause. An example might be a cough caused by taking certain blood pressure medications. You stop the cough by changing the medication. Another example might be a cough caused by acid reflux. Controlling the reflux helps control the cough.  USE OF  BRONCHODILATOR ("RESCUE") INHALERS: There is a risk from using your bronchodilator too frequently.  The risk is that over-reliance on a medication which only relaxes the muscles surrounding the breathing tubes can reduce the effectiveness of medications prescribed to reduce swelling and congestion of the tubes themselves.  Although you feel brief relief from the bronchodilator inhaler, your asthma may actually be worsening with the tubes becoming more swollen and filled with mucus.  This can delay other crucial treatments, such as oral steroid medications. If you need to use a bronchodilator inhaler daily, several times per day, you should discuss this with your provider.  There are probably better treatments that could be used to keep your asthma under control.     HOME CARE . Only take medications as instructed by your medical team. . Complete the entire course of an antibiotic. . Drink plenty of fluids and get plenty of rest. . Avoid close contacts especially the very young and the elderly . Cover your mouth if you cough or cough into your sleeve. . Always remember to wash your hands . A steam or ultrasonic humidifier can help congestion.   GET HELP RIGHT AWAY IF: . You develop worsening fever. . You become short of breath . You cough up blood. . Your symptoms persist after you have completed your treatment plan MAKE SURE YOU   Understand these instructions.  Will watch your condition.  Will get help right away if you are not doing well or get worse.  Your e-visit answers were reviewed by a board certified advanced clinical practitioner to complete   your personal care plan.  Depending on the condition, your plan could have included both over the counter or prescription medications. If there is a problem please reply  once you have received a response from your provider. Your safety is important to us.  If you have drug allergies check your prescription carefully.    You can use MyChart  to ask questions about today's visit, request a non-urgent call back, or ask for a work or school excuse for 24 hours related to this e-Visit. If it has been greater than 24 hours you will need to follow up with your provider, or enter a new e-Visit to address those concerns. You will get an e-mail in the next two days asking about your experience.  I hope that your e-visit has been valuable and will speed your recovery. Thank you for using e-visits.   

## 2017-09-09 ENCOUNTER — Encounter: Payer: Self-pay | Admitting: General Surgery

## 2017-09-09 ENCOUNTER — Ambulatory Visit (INDEPENDENT_AMBULATORY_CARE_PROVIDER_SITE_OTHER): Payer: 59 | Admitting: General Surgery

## 2017-09-09 VITALS — BP 126/80 | HR 92 | Resp 13 | Ht 68.0 in | Wt 185.0 lb

## 2017-09-09 DIAGNOSIS — J208 Acute bronchitis due to other specified organisms: Secondary | ICD-10-CM | POA: Diagnosis not present

## 2017-09-09 DIAGNOSIS — D2371 Other benign neoplasm of skin of right lower limb, including hip: Secondary | ICD-10-CM | POA: Diagnosis not present

## 2017-09-09 DIAGNOSIS — D2272 Melanocytic nevi of left lower limb, including hip: Secondary | ICD-10-CM | POA: Diagnosis not present

## 2017-09-09 DIAGNOSIS — D2370 Other benign neoplasm of skin of unspecified lower limb, including hip: Secondary | ICD-10-CM | POA: Diagnosis not present

## 2017-09-09 DIAGNOSIS — D2271 Melanocytic nevi of right lower limb, including hip: Secondary | ICD-10-CM | POA: Diagnosis not present

## 2017-09-09 MED ORDER — AMOXICILLIN-POT CLAVULANATE 875-125 MG PO TABS: 1.0000 | ORAL_TABLET | Freq: Two times a day (BID) | ORAL | 0 refills | Status: AC

## 2017-09-09 NOTE — Patient Instructions (Signed)
May use buttermilk or yogurt for prevention. You may bath. You can remove the water proof dressing in two days.  May have sutures removed in one week. Follow up as needed.

## 2017-09-09 NOTE — Progress Notes (Addendum)
Patient ID: Margaret Farmer, female   DOB: [birth year], 61 y.o.   MRN: [id number]  Chief Complaint  Patient presents with  . Other    cysts    HPI Margaret Farmer is a 61 y.o. female here for evaluation of cysts on the back of both of her legs. She has two on the right leg and one on the left. These have been there for several years and she nicks them when she shaves her legs. She reports that 2 have enlarged over the years.  She has been fighting a sinus infection.  Previously treated with a Z-Pak.  HPI  Past Medical History:  Diagnosis Date  . Hypertension   . Hypothyroid     Past Surgical History:  Procedure Laterality Date  . COLONOSCOPY  2013  . VEIN SURGERY Left 1986    Family History  Problem Relation Age of Onset  . Breast cancer Neg Hx     Social History Social History   Tobacco Use  . Smoking status: Never Smoker  . Smokeless tobacco: Never Used  Substance Use Topics  . Alcohol use: Yes    Comment: rare  . Drug use: No    No Known Allergies  Current Outpatient Medications  Medication Sig Dispense Refill  . ALPRAZolam (XANAX) 0.25 MG tablet   4  . amLODipine (NORVASC) 5 MG tablet Take 5 mg by mouth daily.    . celecoxib (CELEBREX) 100 MG capsule Take by mouth.    . ciclesonide (OMNARIS) 50 MCG/ACT nasal spray Place into the nose.    . cyclobenzaprine (FLEXERIL) 10 MG tablet Take 10 mg by mouth 3 (three) times daily as needed.     . enalapril (VASOTEC) 10 MG tablet TAKE 1 TABLET BY MOUTH ONCE DAILY 30 tablet 5  . Eszopiclone 3 MG TABS   5  . Liotrix 15 (3.1-12.5) MG (MCG) TABS Take 1 capsule by mouth daily.  0  . MAGNESIUM PO Take 150 mg by mouth.    . promethazine (PHENERGAN) 25 MG tablet Take 25 mg by mouth every 6 (six) hours as needed.   12  . spironolactone (ALDACTONE) 25 MG tablet   12  . SYNTHROID 150 MCG tablet 125 mcg.   11  . zolpidem (AMBIEN) 5 MG tablet Take 5 mg by mouth.    Marland Kitchen amoxicillin-clavulanate (AUGMENTIN) 875-125 MG tablet Take 1  tablet by mouth 2 (two) times daily for 7 days. 14 tablet 0   No current facility-administered medications for this visit.     Review of Systems Review of Systems  Constitutional: Negative.   Respiratory: Negative.   Cardiovascular: Negative.     Blood pressure 126/80, pulse 92, resp. rate 13, height 5\' 8"  (1.727 m), weight 185 lb (83.9 kg).  Physical Exam Physical Exam  Constitutional: She is oriented to person, place, and time. She appears well-developed and well-nourished.  Cardiovascular: Normal rate and regular rhythm.  Pulmonary/Chest: Effort normal and breath sounds normal.  Musculoskeletal:       Legs: Neurological: She is alert and oriented to person, place, and time.  Skin: Skin is warm and dry.  Psychiatric: She has a normal mood and affect.       Assessment    Bronchitis, incomplete resolution.  Multiple dermatofibroma of the lower extremities.    Plan    10 cc of 0.5% Xylocaine with 0.25% Marcaine with 1-200,000 units of epinephrine was utilized and well-tolerated.  ChloraPrep was applied to the skin.  Each of  the lesions which measured about 2.5 mm in diameter were removed with a punch biopsy.  The skin defects were closed at each location with a single 4-0 Prolene suture.  Dry dressings applied.  The patient may return for suture removal next week or her sister, who is an Therapist, sports, can do this for her.    The patient will be contacted when pathology is available.  The patient will make use of Augmentin 875 mg twice daily for the next week.  HPI, Physical Exam, Assessment and Plan have been scribed under the direction and in the presence of Robert Bellow, MD  Concepcion Living, LPN  I have completed the exam and reviewed the above documentation for accuracy and completeness.  I agree with the above.  Haematologist has been used and any errors in dictation or transcription are unintentional.  Hervey Ard, M.D., F.A.C.S.   Robert Bellow 09/10/2017, 9:10 PM

## 2017-11-17 DIAGNOSIS — M1711 Unilateral primary osteoarthritis, right knee: Secondary | ICD-10-CM | POA: Diagnosis not present

## 2017-12-10 DIAGNOSIS — M2550 Pain in unspecified joint: Secondary | ICD-10-CM | POA: Diagnosis not present

## 2017-12-10 DIAGNOSIS — N898 Other specified noninflammatory disorders of vagina: Secondary | ICD-10-CM | POA: Diagnosis not present

## 2017-12-10 DIAGNOSIS — G479 Sleep disorder, unspecified: Secondary | ICD-10-CM | POA: Diagnosis not present

## 2017-12-10 DIAGNOSIS — R232 Flushing: Secondary | ICD-10-CM | POA: Diagnosis not present

## 2017-12-18 DIAGNOSIS — E039 Hypothyroidism, unspecified: Secondary | ICD-10-CM | POA: Diagnosis not present

## 2017-12-18 DIAGNOSIS — N898 Other specified noninflammatory disorders of vagina: Secondary | ICD-10-CM | POA: Diagnosis not present

## 2017-12-18 DIAGNOSIS — R232 Flushing: Secondary | ICD-10-CM | POA: Diagnosis not present

## 2017-12-18 DIAGNOSIS — L659 Nonscarring hair loss, unspecified: Secondary | ICD-10-CM | POA: Diagnosis not present

## 2017-12-18 DIAGNOSIS — G479 Sleep disorder, unspecified: Secondary | ICD-10-CM | POA: Diagnosis not present

## 2018-01-01 ENCOUNTER — Other Ambulatory Visit (INDEPENDENT_AMBULATORY_CARE_PROVIDER_SITE_OTHER): Payer: Self-pay | Admitting: Vascular Surgery

## 2018-01-21 DIAGNOSIS — J309 Allergic rhinitis, unspecified: Secondary | ICD-10-CM | POA: Diagnosis not present

## 2018-01-21 DIAGNOSIS — J040 Acute laryngitis: Secondary | ICD-10-CM | POA: Diagnosis not present

## 2018-01-27 DIAGNOSIS — I10 Essential (primary) hypertension: Secondary | ICD-10-CM | POA: Diagnosis not present

## 2018-01-27 DIAGNOSIS — Z87898 Personal history of other specified conditions: Secondary | ICD-10-CM | POA: Diagnosis not present

## 2018-01-27 DIAGNOSIS — E7849 Other hyperlipidemia: Secondary | ICD-10-CM | POA: Diagnosis not present

## 2018-01-27 DIAGNOSIS — I871 Compression of vein: Secondary | ICD-10-CM | POA: Diagnosis not present

## 2018-02-03 DIAGNOSIS — Z124 Encounter for screening for malignant neoplasm of cervix: Secondary | ICD-10-CM | POA: Diagnosis not present

## 2018-02-03 DIAGNOSIS — E7849 Other hyperlipidemia: Secondary | ICD-10-CM | POA: Diagnosis not present

## 2018-02-03 DIAGNOSIS — I10 Essential (primary) hypertension: Secondary | ICD-10-CM | POA: Diagnosis not present

## 2018-02-03 DIAGNOSIS — E039 Hypothyroidism, unspecified: Secondary | ICD-10-CM | POA: Diagnosis not present

## 2018-02-03 DIAGNOSIS — I871 Compression of vein: Secondary | ICD-10-CM | POA: Diagnosis not present

## 2018-03-16 DIAGNOSIS — M7731 Calcaneal spur, right foot: Secondary | ICD-10-CM | POA: Diagnosis not present

## 2018-04-08 DIAGNOSIS — G5751 Tarsal tunnel syndrome, right lower limb: Secondary | ICD-10-CM | POA: Diagnosis not present

## 2018-04-08 DIAGNOSIS — M722 Plantar fascial fibromatosis: Secondary | ICD-10-CM | POA: Diagnosis not present

## 2018-04-23 DIAGNOSIS — R232 Flushing: Secondary | ICD-10-CM | POA: Diagnosis not present

## 2018-04-23 DIAGNOSIS — N898 Other specified noninflammatory disorders of vagina: Secondary | ICD-10-CM | POA: Diagnosis not present

## 2018-04-23 DIAGNOSIS — G479 Sleep disorder, unspecified: Secondary | ICD-10-CM | POA: Diagnosis not present

## 2018-04-26 DIAGNOSIS — N951 Menopausal and female climacteric states: Secondary | ICD-10-CM | POA: Diagnosis not present

## 2018-04-26 DIAGNOSIS — E039 Hypothyroidism, unspecified: Secondary | ICD-10-CM | POA: Diagnosis not present

## 2018-04-26 DIAGNOSIS — G479 Sleep disorder, unspecified: Secondary | ICD-10-CM | POA: Diagnosis not present

## 2018-04-26 DIAGNOSIS — N898 Other specified noninflammatory disorders of vagina: Secondary | ICD-10-CM | POA: Diagnosis not present

## 2018-04-26 DIAGNOSIS — R232 Flushing: Secondary | ICD-10-CM | POA: Diagnosis not present

## 2018-04-30 ENCOUNTER — Ambulatory Visit: Payer: 59 | Admitting: Podiatry

## 2018-05-06 DIAGNOSIS — M722 Plantar fascial fibromatosis: Secondary | ICD-10-CM | POA: Diagnosis not present

## 2018-05-06 DIAGNOSIS — G5751 Tarsal tunnel syndrome, right lower limb: Secondary | ICD-10-CM | POA: Diagnosis not present

## 2018-06-17 DIAGNOSIS — E039 Hypothyroidism, unspecified: Secondary | ICD-10-CM | POA: Diagnosis not present

## 2018-06-24 DIAGNOSIS — E039 Hypothyroidism, unspecified: Secondary | ICD-10-CM | POA: Diagnosis not present

## 2018-06-24 DIAGNOSIS — M722 Plantar fascial fibromatosis: Secondary | ICD-10-CM | POA: Diagnosis not present

## 2018-06-24 DIAGNOSIS — G5751 Tarsal tunnel syndrome, right lower limb: Secondary | ICD-10-CM | POA: Diagnosis not present

## 2018-08-06 DIAGNOSIS — I10 Essential (primary) hypertension: Secondary | ICD-10-CM | POA: Diagnosis not present

## 2018-08-06 DIAGNOSIS — E039 Hypothyroidism, unspecified: Secondary | ICD-10-CM | POA: Diagnosis not present

## 2018-08-06 DIAGNOSIS — M79671 Pain in right foot: Secondary | ICD-10-CM | POA: Diagnosis not present

## 2018-08-06 DIAGNOSIS — E7849 Other hyperlipidemia: Secondary | ICD-10-CM | POA: Diagnosis not present

## 2018-08-06 DIAGNOSIS — I871 Compression of vein: Secondary | ICD-10-CM | POA: Diagnosis not present

## 2018-08-23 DIAGNOSIS — Z1212 Encounter for screening for malignant neoplasm of rectum: Secondary | ICD-10-CM | POA: Diagnosis not present

## 2018-08-23 DIAGNOSIS — Z1211 Encounter for screening for malignant neoplasm of colon: Secondary | ICD-10-CM | POA: Diagnosis not present

## 2018-08-27 DIAGNOSIS — N951 Menopausal and female climacteric states: Secondary | ICD-10-CM | POA: Diagnosis not present

## 2018-08-27 DIAGNOSIS — E7849 Other hyperlipidemia: Secondary | ICD-10-CM | POA: Diagnosis not present

## 2018-08-27 DIAGNOSIS — G479 Sleep disorder, unspecified: Secondary | ICD-10-CM | POA: Diagnosis not present

## 2018-08-27 DIAGNOSIS — N898 Other specified noninflammatory disorders of vagina: Secondary | ICD-10-CM | POA: Diagnosis not present

## 2018-08-27 DIAGNOSIS — I10 Essential (primary) hypertension: Secondary | ICD-10-CM | POA: Diagnosis not present

## 2018-08-27 DIAGNOSIS — E039 Hypothyroidism, unspecified: Secondary | ICD-10-CM | POA: Diagnosis not present

## 2018-08-27 DIAGNOSIS — L659 Nonscarring hair loss, unspecified: Secondary | ICD-10-CM | POA: Diagnosis not present

## 2018-08-27 DIAGNOSIS — M2550 Pain in unspecified joint: Secondary | ICD-10-CM | POA: Diagnosis not present

## 2018-08-27 DIAGNOSIS — R232 Flushing: Secondary | ICD-10-CM | POA: Diagnosis not present

## 2018-09-01 ENCOUNTER — Other Ambulatory Visit: Payer: Self-pay | Admitting: Internal Medicine

## 2018-09-01 DIAGNOSIS — E039 Hypothyroidism, unspecified: Secondary | ICD-10-CM | POA: Diagnosis not present

## 2018-09-01 DIAGNOSIS — Z1231 Encounter for screening mammogram for malignant neoplasm of breast: Secondary | ICD-10-CM

## 2018-09-01 DIAGNOSIS — G479 Sleep disorder, unspecified: Secondary | ICD-10-CM | POA: Diagnosis not present

## 2018-09-01 DIAGNOSIS — N951 Menopausal and female climacteric states: Secondary | ICD-10-CM | POA: Diagnosis not present

## 2018-09-01 DIAGNOSIS — R232 Flushing: Secondary | ICD-10-CM | POA: Diagnosis not present

## 2018-09-09 ENCOUNTER — Ambulatory Visit
Admission: RE | Admit: 2018-09-09 | Discharge: 2018-09-09 | Disposition: A | Discharge status: Home / Self Care | Payer: 59 | Source: Ambulatory Visit | Attending: Internal Medicine | Admitting: Internal Medicine

## 2018-09-09 DIAGNOSIS — Z1231 Encounter for screening mammogram for malignant neoplasm of breast: Secondary | ICD-10-CM | POA: Diagnosis not present

## 2018-09-09 DIAGNOSIS — E039 Hypothyroidism, unspecified: Secondary | ICD-10-CM | POA: Diagnosis not present

## 2018-09-09 DIAGNOSIS — D751 Secondary polycythemia: Secondary | ICD-10-CM | POA: Diagnosis not present

## 2018-09-09 DIAGNOSIS — E7849 Other hyperlipidemia: Secondary | ICD-10-CM | POA: Diagnosis not present

## 2018-09-09 DIAGNOSIS — I10 Essential (primary) hypertension: Secondary | ICD-10-CM | POA: Diagnosis not present

## 2018-09-20 DIAGNOSIS — M79605 Pain in left leg: Secondary | ICD-10-CM | POA: Diagnosis not present

## 2018-09-20 DIAGNOSIS — S8992XA Unspecified injury of left lower leg, initial encounter: Secondary | ICD-10-CM | POA: Diagnosis not present

## 2018-09-20 DIAGNOSIS — M7989 Other specified soft tissue disorders: Secondary | ICD-10-CM | POA: Diagnosis not present

## 2018-09-21 ENCOUNTER — Other Ambulatory Visit: Payer: Self-pay | Admitting: Family Medicine

## 2018-09-21 ENCOUNTER — Ambulatory Visit
Admission: RE | Admit: 2018-09-21 | Discharge: 2018-09-21 | Disposition: A | Discharge status: Home / Self Care | Payer: 59 | Source: Ambulatory Visit | Attending: Family Medicine | Admitting: Family Medicine

## 2018-09-21 DIAGNOSIS — R600 Localized edema: Secondary | ICD-10-CM | POA: Diagnosis not present

## 2018-09-21 DIAGNOSIS — S8992XA Unspecified injury of left lower leg, initial encounter: Secondary | ICD-10-CM | POA: Diagnosis not present

## 2018-09-21 DIAGNOSIS — M7989 Other specified soft tissue disorders: Secondary | ICD-10-CM | POA: Diagnosis not present

## 2018-09-21 DIAGNOSIS — M79605 Pain in left leg: Secondary | ICD-10-CM | POA: Diagnosis not present

## 2018-09-23 DIAGNOSIS — S8002XA Contusion of left knee, initial encounter: Secondary | ICD-10-CM | POA: Diagnosis not present

## 2018-10-15 ENCOUNTER — Telehealth (INDEPENDENT_AMBULATORY_CARE_PROVIDER_SITE_OTHER): Payer: Self-pay | Admitting: Vascular Surgery

## 2018-10-15 NOTE — Telephone Encounter (Signed)
Patient should come in for DVT study for the leg that is hurting on Monday..can see me or schnier

## 2018-10-18 ENCOUNTER — Other Ambulatory Visit (INDEPENDENT_AMBULATORY_CARE_PROVIDER_SITE_OTHER): Payer: Self-pay | Admitting: Nurse Practitioner

## 2018-10-18 ENCOUNTER — Ambulatory Visit (INDEPENDENT_AMBULATORY_CARE_PROVIDER_SITE_OTHER): Payer: Commercial Managed Care - PPO

## 2018-10-18 ENCOUNTER — Encounter (INDEPENDENT_AMBULATORY_CARE_PROVIDER_SITE_OTHER): Payer: Self-pay | Admitting: Nurse Practitioner

## 2018-10-18 ENCOUNTER — Ambulatory Visit (INDEPENDENT_AMBULATORY_CARE_PROVIDER_SITE_OTHER): Payer: Commercial Managed Care - PPO | Admitting: Nurse Practitioner

## 2018-10-18 VITALS — BP 167/94 | HR 112 | Resp 12 | Ht 68.0 in | Wt 188.4 lb

## 2018-10-18 DIAGNOSIS — I871 Compression of vein: Secondary | ICD-10-CM

## 2018-10-18 DIAGNOSIS — I890 Lymphedema, not elsewhere classified: Secondary | ICD-10-CM | POA: Diagnosis not present

## 2018-10-18 DIAGNOSIS — I10 Essential (primary) hypertension: Secondary | ICD-10-CM

## 2018-10-18 DIAGNOSIS — M79605 Pain in left leg: Secondary | ICD-10-CM

## 2018-10-19 ENCOUNTER — Encounter (INDEPENDENT_AMBULATORY_CARE_PROVIDER_SITE_OTHER): Payer: Self-pay | Admitting: Nurse Practitioner
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
## 2018-10-22 DIAGNOSIS — S8002XA Contusion of left knee, initial encounter: Secondary | ICD-10-CM | POA: Diagnosis not present

## 2018-10-27 ENCOUNTER — Other Ambulatory Visit (INDEPENDENT_AMBULATORY_CARE_PROVIDER_SITE_OTHER): Payer: Self-pay | Admitting: Vascular Surgery

## 2018-10-27 NOTE — Telephone Encounter (Signed)
Please advise on this medication  °

## 2018-10-27 NOTE — Telephone Encounter (Signed)
She should contact her PCP in regards to refills.

## 2018-11-29 ENCOUNTER — Ambulatory Visit (INDEPENDENT_AMBULATORY_CARE_PROVIDER_SITE_OTHER): Payer: Commercial Managed Care - PPO | Admitting: Vascular Surgery

## 2018-11-30 ENCOUNTER — Other Ambulatory Visit (INDEPENDENT_AMBULATORY_CARE_PROVIDER_SITE_OTHER): Payer: Self-pay | Admitting: Vascular Surgery

## 2018-12-01 NOTE — Telephone Encounter (Signed)
Should this Rx be refilled

## 2018-12-01 NOTE — Telephone Encounter (Signed)
She should refer to PCP

## 2018-12-23 DIAGNOSIS — G5751 Tarsal tunnel syndrome, right lower limb: Secondary | ICD-10-CM | POA: Diagnosis not present

## 2018-12-23 DIAGNOSIS — M722 Plantar fascial fibromatosis: Secondary | ICD-10-CM | POA: Diagnosis not present

## 2018-12-31 MED FILL — ZOLMitriptan 2.5 MG TBDP: 2.5 | 30 days supply | Qty: 12 | Fill #0

## 2018-12-31 MED FILL — LINZESS 145 MCG CAPSULE: 145 | 30 days supply | Qty: 30 | Fill #0

## 2018-12-31 MED FILL — ALPRAZolam 0.5 MG TABS: 0.5 | 20 days supply | Qty: 60 | Fill #0

## 2018-12-31 MED FILL — PROMETHAZINE 25 MG TABLET: 25 | 7 days supply | Qty: 30 | Fill #0

## 2018-12-31 MED FILL — SYNTHROID 137 MCG TABLET: 137 | 30 days supply | Qty: 30 | Fill #0

## 2018-12-31 MED FILL — ESZOPICLONE 3 MG TABS: 3 | 15 days supply | Qty: 15 | Fill #0

## 2018-12-31 MED FILL — AMLODIPINE BESYLATE 5 MG TA: 5 | 90 days supply | Qty: 90 | Fill #0

## 2018-12-31 MED FILL — MONTELUKAST SOD 10 MG TAB: 10 | 60 days supply | Qty: 60 | Fill #0

## 2018-12-31 MED FILL — tiZANidine HCL 4 MG TABS: 4 | 30 days supply | Qty: 90 | Fill #0

## 2018-12-31 MED FILL — ZOLPIDEM TARTRATE 5 MG TAB: 5 | 15 days supply | Qty: 15 | Fill #0

## 2018-12-31 MED FILL — SPIRONOLACTONE 25 MG TABS: 25 | 90 days supply | Qty: 90 | Fill #0

## 2019-01-19 MED FILL — DICLOFENAC 1.5% TOPICAL SOL: 1.5 | 19 days supply | Qty: 150 | Fill #0

## 2019-01-22 MED FILL — DICLOFENAC 1.5% TOPICAL SOL: 1.5 | 19 days supply | Qty: 150 | Fill #1

## 2019-01-24 MED FILL — SYNTHROID 137 MCG TABLET: 137 | 30 days supply | Qty: 30 | Fill #1

## 2019-02-16 MED FILL — GABAPENTIN 300 MG CAPSULE: 300 | 30 days supply | Qty: 60 | Fill #0

## 2019-02-16 MED FILL — ESZOPICLONE 3 MG TABS: 3 | 15 days supply | Qty: 15 | Fill #1

## 2019-02-16 MED FILL — PROMETHAZINE 25 MG TABLET: 25 | 7 days supply | Qty: 30 | Fill #1

## 2019-02-16 MED FILL — tiZANidine HCL 4 MG TABS: 4 | 30 days supply | Qty: 90 | Fill #1

## 2019-02-16 MED FILL — ZOLPIDEM TARTRATE 5 MG TAB: 5 | 15 days supply | Qty: 15 | Fill #1

## 2019-02-16 MED FILL — ALPRAZolam 0.5 MG TABS: 0.5 | 20 days supply | Qty: 60 | Fill #0

## 2019-02-16 MED FILL — LINZESS 145 MCG CAPSULE: 145 | 30 days supply | Qty: 30 | Fill #0

## 2019-02-17 MED FILL — PREMARIN VAGINAL CREAM-APPL: 0.625 | 90 days supply | Qty: 30 | Fill #0

## 2019-02-17 MED FILL — DICLOFENAC 1.5% TOPICAL SOL: 1.5 | 19 days supply | Qty: 150 | Fill #2

## 2019-02-17 MED FILL — ZOLMitriptan 2.5 MG TBDP: 2.5 | 30 days supply | Qty: 12 | Fill #1

## 2019-02-17 MED FILL — SYNTHROID 137 MCG TABLET: 137 | 30 days supply | Qty: 30 | Fill #0

## 2019-02-23 MED FILL — MONTELUKAST SOD 10 MG TAB: 10 | 90 days supply | Qty: 90 | Fill #1

## 2019-03-15 MED FILL — tiZANidine HCL 4 MG TABS: 4 | 30 days supply | Qty: 90 | Fill #2

## 2019-03-15 MED FILL — GABAPENTIN 300 MG CAPSULE: 300 | 30 days supply | Qty: 60 | Fill #1

## 2019-03-15 MED FILL — ESZOPICLONE 3 MG TABS: 3 | 15 days supply | Qty: 15 | Fill #2

## 2019-03-15 MED FILL — PROMETHAZINE 25 MG TABLET: 25 | 7 days supply | Qty: 30 | Fill #2

## 2019-03-15 MED FILL — ZOLPIDEM TARTRATE 5 MG TAB: 5 | 15 days supply | Qty: 15 | Fill #2

## 2019-03-18 MED FILL — ALPRAZolam 0.5 MG TABS: 0.5 | 20 days supply | Qty: 60 | Fill #1

## 2019-03-21 MED FILL — SPIRONOLACTONE 25 MG TABS: 25 | 90 days supply | Qty: 90 | Fill #0

## 2019-03-23 IMAGING — MG MM DIGITAL SCREENING BILAT W/ CAD
5 series · 5 of 5 positions shown · non-contrast
Comparison: Previous exam(s).

CLINICAL DATA: Screening.

EXAM:
DIGITAL SCREENING BILATERAL MAMMOGRAM WITH CAD

[R CC (1 of 2)]
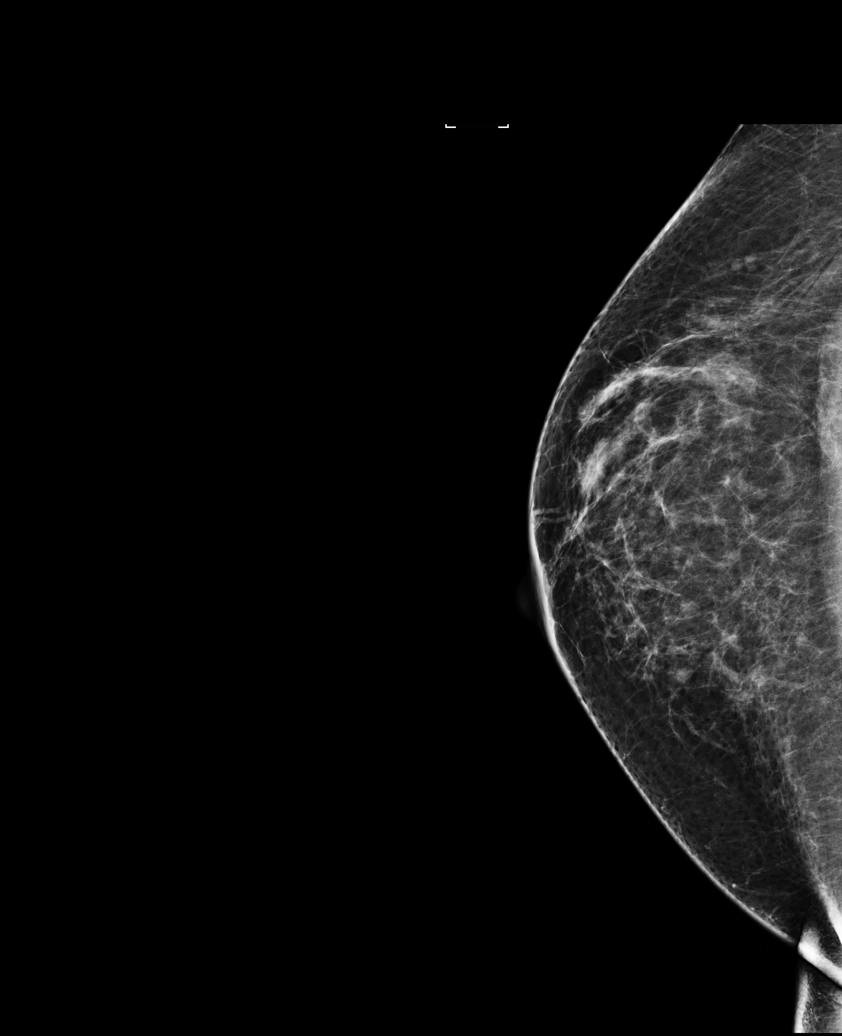

[R CC (2 of 2)]
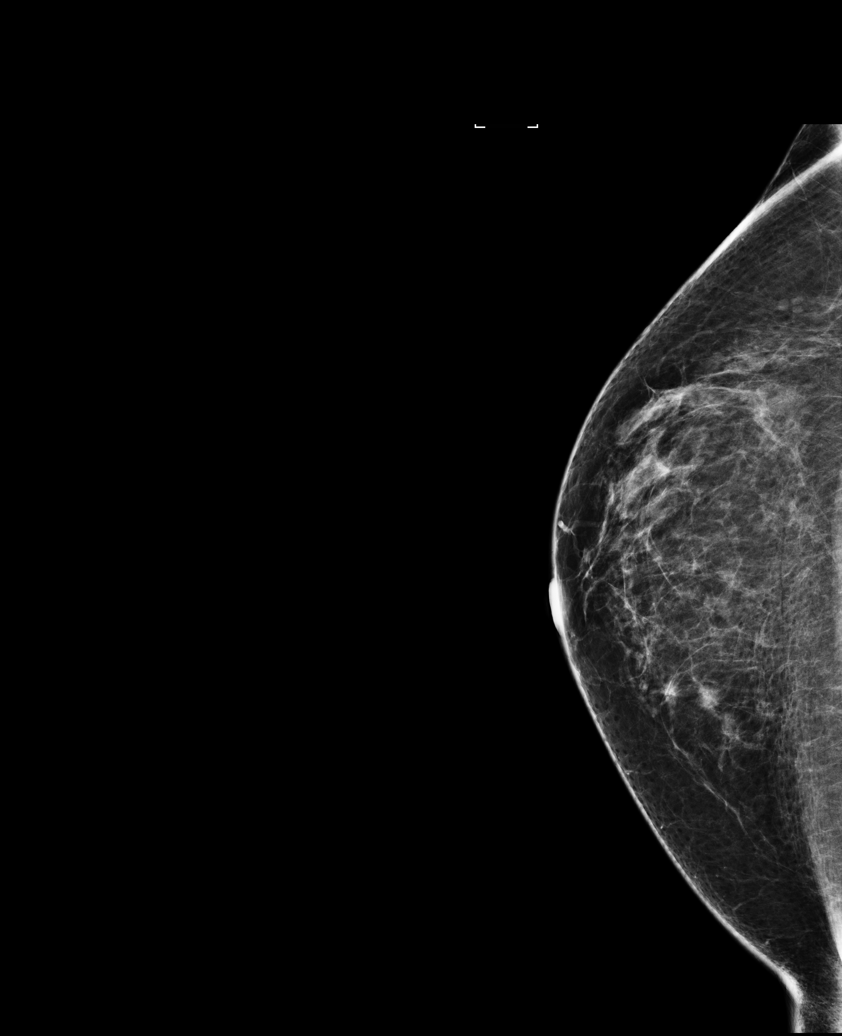

[R MLO]
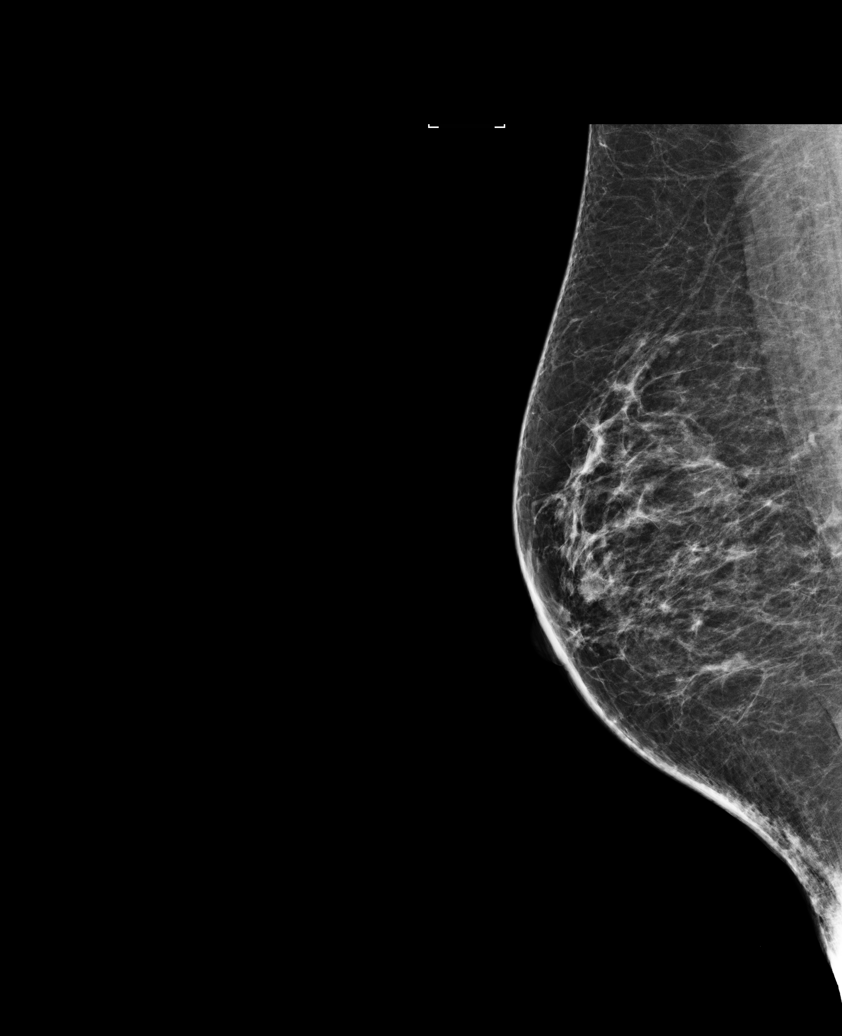

[L MLO]
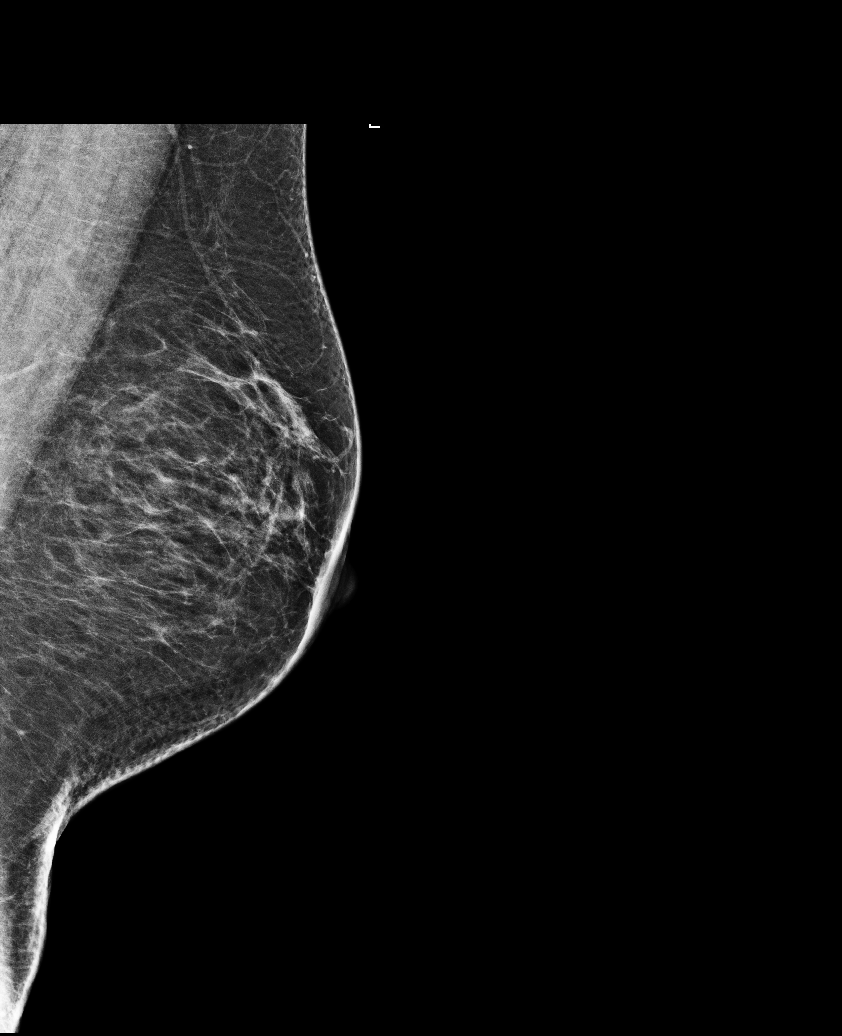

[L CC]
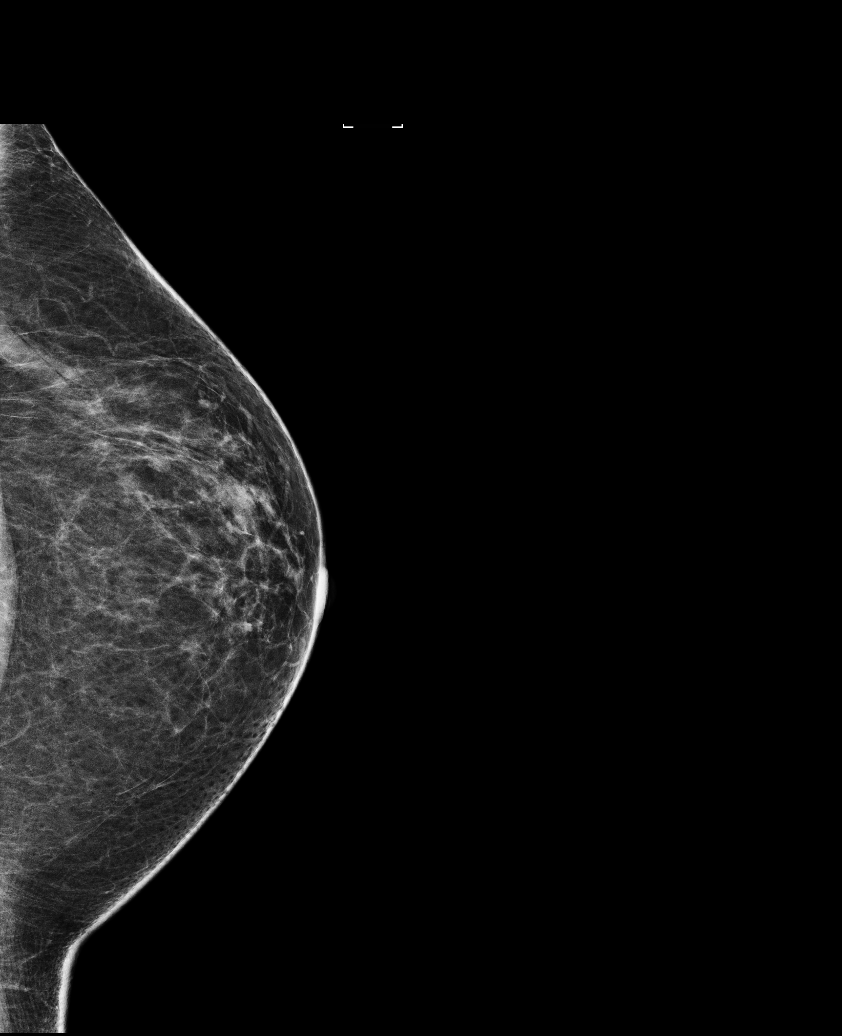

[5 of 5 positions shown; findings below may reference images not displayed]

ACR Breast Density Category b: There are scattered areas of
fibroglandular density.
FINDINGS: There are no findings suspicious for malignancy. Images were
processed with CAD.
IMPRESSION: No mammographic evidence of malignancy. A result letter of this
screening mammogram will be mailed directly to the patient.

RECOMMENDATION:
Screening mammogram in one year. (Code:AS-G-LCT)

BI-RADS CATEGORY  1: Negative.

## 2019-03-30 MED FILL — LEVOTHYROXINE 137 MCG TABLE: 137 | 90 days supply | Qty: 90 | Fill #0

## 2019-04-11 MED FILL — GABAPENTIN 300 MG CAPSULE: 300 | 30 days supply | Qty: 60 | Fill #2

## 2019-04-13 MED FILL — SPIRONOLACTONE 25 MG TABS: 25 | 90 days supply | Qty: 90 | Fill #0

## 2019-04-13 MED FILL — ZOLPIDEM TARTRATE 5 MG TAB: 5 | 15 days supply | Qty: 15 | Fill #3

## 2019-04-13 MED FILL — PROMETHAZINE 25 MG TABLET: 25 | 7 days supply | Qty: 30 | Fill #3

## 2019-04-13 MED FILL — tiZANidine HCL 4 MG TABS: 4 | 30 days supply | Qty: 90 | Fill #3

## 2019-04-14 MED FILL — ESZOPICLONE 3 MG TABS: 3 | 15 days supply | Qty: 15 | Fill #0

## 2019-04-18 MED FILL — AMLODIPINE BESYLATE 5 MG TA: 5 | 90 days supply | Qty: 90 | Fill #1

## 2019-04-19 DIAGNOSIS — M545 Low back pain: Secondary | ICD-10-CM | POA: Diagnosis not present

## 2019-04-28 MED FILL — ESZOPICLONE 3 MG TABS: 3 | 15 days supply | Qty: 15 | Fill #0

## 2019-05-08 MED FILL — PROMETHAZINE 25 MG TABLET: 25 | 7 days supply | Qty: 30 | Fill #4

## 2019-05-09 DIAGNOSIS — M5136 Other intervertebral disc degeneration, lumbar region: Secondary | ICD-10-CM | POA: Diagnosis not present

## 2019-05-09 DIAGNOSIS — M47816 Spondylosis without myelopathy or radiculopathy, lumbar region: Secondary | ICD-10-CM | POA: Diagnosis not present

## 2019-05-09 MED FILL — tiZANidine HCL 4 MG TABS: 4 | 30 days supply | Qty: 90 | Fill #4

## 2019-05-09 MED FILL — ENALAPRIL MALEATE 10 MG TAB: 10 | 30 days supply | Qty: 30 | Fill #0

## 2019-05-09 MED FILL — DICLOFENAC 1.5% TOPICAL SOL: 1.5 | 19 days supply | Qty: 150 | Fill #3

## 2019-05-09 MED FILL — MONTELUKAST SOD 10 MG TAB: 10 | 30 days supply | Qty: 30 | Fill #2

## 2019-05-09 MED FILL — ZOLMitriptan 2.5 MG TBDP: 2.5 | 30 days supply | Qty: 12 | Fill #0

## 2019-05-09 MED FILL — ZOLPIDEM TARTRATE 5 MG TAB: 5 | 15 days supply | Qty: 15 | Fill #0

## 2019-05-10 MED FILL — ESZOPICLONE 3 MG TABS: 3 | 15 days supply | Qty: 15 | Fill #1

## 2019-05-11 MED FILL — GABAPENTIN 300 MG CAPSULE: 300 | 30 days supply | Qty: 60 | Fill #3

## 2019-05-12 MED FILL — PREMARIN VAGINAL CREAM-APPL: 0.625 | 90 days supply | Qty: 30 | Fill #1

## 2019-05-24 MED FILL — ZOLPIDEM TARTRATE 5 MG TAB: 5 | 15 days supply | Qty: 15 | Fill #1

## 2019-05-25 ENCOUNTER — Ambulatory Visit: Payer: 59

## 2019-05-25 MED FILL — ESZOPICLONE 3 MG TABS: 3 | 15 days supply | Qty: 15 | Fill #2

## 2019-05-30 ENCOUNTER — Ambulatory Visit: Payer: 59

## 2019-06-02 MED FILL — ENALAPRIL MALEATE 10 MG TAB: 10 | 30 days supply | Qty: 30 | Fill #0

## 2019-06-05 MED FILL — PROMETHAZINE 25 MG TABLET: 25 | 7 days supply | Qty: 30 | Fill #5

## 2019-06-05 MED FILL — tiZANidine HCL 4 MG TABS: 4 | 30 days supply | Qty: 90 | Fill #5

## 2019-06-06 MED FILL — ESZOPICLONE 3 MG TABS: 3 | 30 days supply | Qty: 15 | Fill #1

## 2019-06-06 MED FILL — ZOLPIDEM TARTRATE 5 MG TAB: 5 | 15 days supply | Qty: 15 | Fill #2

## 2019-06-08 MED FILL — MONTELUKAST SOD 10 MG TAB: 10 | 90 days supply | Qty: 90 | Fill #0

## 2019-06-10 MED FILL — GABAPENTIN 300 MG CAPSULE: 300 | 30 days supply | Qty: 60 | Fill #4

## 2019-06-21 MED FILL — ZOLPIDEM TARTRATE 5 MG TAB: 5 | 15 days supply | Qty: 15 | Fill #3

## 2019-06-22 MED FILL — SYNTHROID 137 MCG TABLET: 137 | 90 days supply | Qty: 90 | Fill #1

## 2019-06-27 MED FILL — ENALAPRIL MALEATE 10 MG TAB: 10 | 30 days supply | Qty: 30 | Fill #0

## 2019-06-29 DIAGNOSIS — D751 Secondary polycythemia: Secondary | ICD-10-CM | POA: Diagnosis not present

## 2019-06-29 DIAGNOSIS — I10 Essential (primary) hypertension: Secondary | ICD-10-CM | POA: Diagnosis not present

## 2019-06-29 DIAGNOSIS — E039 Hypothyroidism, unspecified: Secondary | ICD-10-CM | POA: Diagnosis not present

## 2019-06-29 DIAGNOSIS — E7849 Other hyperlipidemia: Secondary | ICD-10-CM | POA: Diagnosis not present

## 2019-07-06 DIAGNOSIS — I10 Essential (primary) hypertension: Secondary | ICD-10-CM | POA: Diagnosis not present

## 2019-07-06 DIAGNOSIS — E039 Hypothyroidism, unspecified: Secondary | ICD-10-CM | POA: Diagnosis not present

## 2019-07-06 DIAGNOSIS — Z0000 Encounter for general adult medical examination without abnormal findings: Secondary | ICD-10-CM | POA: Diagnosis not present

## 2019-07-06 DIAGNOSIS — M8588 Other specified disorders of bone density and structure, other site: Secondary | ICD-10-CM | POA: Diagnosis not present

## 2019-07-06 DIAGNOSIS — E7849 Other hyperlipidemia: Secondary | ICD-10-CM | POA: Diagnosis not present

## 2019-07-06 DIAGNOSIS — Z23 Encounter for immunization: Secondary | ICD-10-CM | POA: Diagnosis not present

## 2019-07-06 MED FILL — SPIRONOLACTONE 25 MG TABS: 25 | 90 days supply | Qty: 90 | Fill #1

## 2019-07-10 MED FILL — tiZANidine HCL 4 MG TABS: 4 | 30 days supply | Qty: 90 | Fill #6

## 2019-07-10 MED FILL — PROMETHAZINE 25 MG TABLET: 25 | 7 days supply | Qty: 30 | Fill #6

## 2019-07-11 MED FILL — ZOLMitriptan 2.5 MG TBDP: 2.5 | 30 days supply | Qty: 12 | Fill #1

## 2019-07-11 MED FILL — AMLODIPINE BESYLATE 5 MG TA: 5 | 30 days supply | Qty: 30 | Fill #2

## 2019-07-31 MED FILL — PROMETHAZINE 25 MG TABLET: 25 | 7 days supply | Qty: 30 | Fill #7

## 2019-08-01 MED FILL — ESZOPICLONE 3 MG TABS: 3 | 30 days supply | Qty: 15 | Fill #2

## 2019-08-03 MED FILL — ZOLPIDEM TARTRATE 5 MG TAB: 5 | 30 days supply | Qty: 15 | Fill #0

## 2019-08-10 MED FILL — AMLODIPINE BESYLATE 5 MG TA: 5 | 90 days supply | Qty: 90 | Fill #0

## 2019-08-10 MED FILL — PREMARIN VAGINAL CREAM-APPL: 0.625 | 90 days supply | Qty: 30 | Fill #2

## 2019-08-24 MED FILL — ENALAPRIL MALEATE 10 MG TAB: 10 | 30 days supply | Qty: 30 | Fill #0

## 2019-08-24 MED FILL — tiZANidine HCL 4 MG TABS: 4 | 30 days supply | Qty: 90 | Fill #7

## 2019-08-31 MED FILL — ZOLPIDEM TARTRATE 5 MG TAB: 5 | 30 days supply | Qty: 15 | Fill #1

## 2019-09-06 MED FILL — MONTELUKAST SOD 10 MG TAB: 10 | 90 days supply | Qty: 90 | Fill #0

## 2019-09-13 MED FILL — SYNTHROID 137 MCG TABLET: 137 | 90 days supply | Qty: 90 | Fill #2

## 2019-09-19 MED FILL — ENALAPRIL MALEATE 10 MG TAB: 10 | 30 days supply | Qty: 30 | Fill #1

## 2019-09-22 MED FILL — ESZOPICLONE 3 MG TABS: 3 | 30 days supply | Qty: 30 | Fill #0

## 2019-09-30 MED FILL — ZOLPIDEM TARTRATE 5 MG TAB: 5 | 30 days supply | Qty: 15 | Fill #2

## 2019-10-04 MED FILL — SPIRONOLACTONE 25 MG TABS: 25 | 90 days supply | Qty: 90 | Fill #2

## 2019-10-05 MED FILL — tiZANidine HCL 4 MG TABS: 4 | 30 days supply | Qty: 90 | Fill #8

## 2019-10-05 MED FILL — PROMETHAZINE 25 MG TABLET: 25 | 7 days supply | Qty: 30 | Fill #8

## 2019-10-05 MED FILL — ALPRAZolam 0.5 MG TABS: 0.5 | 20 days supply | Qty: 60 | Fill #0

## 2019-10-07 DIAGNOSIS — G479 Sleep disorder, unspecified: Secondary | ICD-10-CM | POA: Diagnosis not present

## 2019-10-07 DIAGNOSIS — R232 Flushing: Secondary | ICD-10-CM | POA: Diagnosis not present

## 2019-10-07 DIAGNOSIS — E039 Hypothyroidism, unspecified: Secondary | ICD-10-CM | POA: Diagnosis not present

## 2019-10-07 DIAGNOSIS — N951 Menopausal and female climacteric states: Secondary | ICD-10-CM | POA: Diagnosis not present

## 2019-10-11 DIAGNOSIS — N951 Menopausal and female climacteric states: Secondary | ICD-10-CM | POA: Diagnosis not present

## 2019-10-11 DIAGNOSIS — N898 Other specified noninflammatory disorders of vagina: Secondary | ICD-10-CM | POA: Diagnosis not present

## 2019-10-11 DIAGNOSIS — R232 Flushing: Secondary | ICD-10-CM | POA: Diagnosis not present

## 2019-10-17 MED FILL — ENALAPRIL MALEATE 10 MG TAB: 10 | 30 days supply | Qty: 30 | Fill #2

## 2019-10-20 MED FILL — ESZOPICLONE 3 MG TABS: 3 | 30 days supply | Qty: 30 | Fill #1

## 2019-10-21 MED FILL — AMLODIPINE BESYLATE 5 MG TA: 5 | 90 days supply | Qty: 90 | Fill #1

## 2019-10-31 MED FILL — ZOLPIDEM TARTRATE 5 MG TAB: 5 | 30 days supply | Qty: 15 | Fill #3

## 2019-11-08 DIAGNOSIS — N898 Other specified noninflammatory disorders of vagina: Secondary | ICD-10-CM | POA: Diagnosis not present

## 2019-11-08 DIAGNOSIS — R232 Flushing: Secondary | ICD-10-CM | POA: Diagnosis not present

## 2019-11-08 DIAGNOSIS — N951 Menopausal and female climacteric states: Secondary | ICD-10-CM | POA: Diagnosis not present

## 2019-11-10 MED FILL — PREMARIN VAGINAL CREAM-APPL: 0.625 | 90 days supply | Qty: 30 | Fill #0

## 2019-11-11 DIAGNOSIS — R232 Flushing: Secondary | ICD-10-CM | POA: Diagnosis not present

## 2019-11-11 DIAGNOSIS — E039 Hypothyroidism, unspecified: Secondary | ICD-10-CM | POA: Diagnosis not present

## 2019-11-11 DIAGNOSIS — G479 Sleep disorder, unspecified: Secondary | ICD-10-CM | POA: Diagnosis not present

## 2019-11-11 DIAGNOSIS — N951 Menopausal and female climacteric states: Secondary | ICD-10-CM | POA: Diagnosis not present

## 2019-11-11 DIAGNOSIS — M2550 Pain in unspecified joint: Secondary | ICD-10-CM | POA: Diagnosis not present

## 2019-11-11 MED FILL — ENALAPRIL MALEATE 10 MG TAB: 10 | 30 days supply | Qty: 30 | Fill #3

## 2019-11-19 MED FILL — ESZOPICLONE 3 MG TABS: 3 | 30 days supply | Qty: 30 | Fill #2

## 2019-11-19 MED FILL — ZOLMitriptan 2.5 MG TBDP: 2.5 | 30 days supply | Qty: 12 | Fill #0

## 2019-11-19 MED FILL — PROMETHAZINE 25 MG TABLET: 25 | 7 days supply | Qty: 30 | Fill #1

## 2019-11-29 MED FILL — ZOLPIDEM TARTRATE 5 MG TAB: 5 | 30 days supply | Qty: 15 | Fill #0

## 2019-12-12 MED FILL — SYNTHROID 137 MCG TABLET: 137 | 90 days supply | Qty: 90 | Fill #3

## 2019-12-12 MED FILL — ENALAPRIL MALEATE 10 MG TAB: 10 | 30 days supply | Qty: 30 | Fill #0

## 2019-12-19 MED FILL — ESZOPICLONE 3 MG TABS: 3 | 30 days supply | Qty: 30 | Fill #0

## 2019-12-26 MED FILL — PROMETHAZINE 25 MG TABLET: 25 | 7 days supply | Qty: 30 | Fill #2

## 2019-12-26 MED FILL — ZOLPIDEM TARTRATE 5 MG TAB: 5 | 30 days supply | Qty: 15 | Fill #1

## 2019-12-27 ENCOUNTER — Other Ambulatory Visit: Payer: Self-pay | Admitting: Internal Medicine

## 2019-12-27 MED FILL — SPIRONOLACTONE 25 MG TABS: 25 | 90 days supply | Qty: 90 | Fill #0

## 2019-12-28 ENCOUNTER — Other Ambulatory Visit: Payer: Self-pay | Admitting: Internal Medicine

## 2019-12-28 DIAGNOSIS — Z1231 Encounter for screening mammogram for malignant neoplasm of breast: Secondary | ICD-10-CM

## 2020-01-05 MED FILL — LIDOCAINE PATCH 5%: 5 | 30 days supply | Qty: 30 | Fill #0

## 2020-01-05 MED FILL — ENALAPRIL MALEATE 10 MG TAB: 10 | 30 days supply | Qty: 30 | Fill #1

## 2020-01-10 ENCOUNTER — Other Ambulatory Visit: Payer: Self-pay

## 2020-01-10 ENCOUNTER — Ambulatory Visit
Admission: RE | Admit: 2020-01-10 | Discharge: 2020-01-10 | Disposition: A | Discharge status: Home / Self Care | Payer: Medicare Other | Source: Ambulatory Visit | Attending: Internal Medicine | Admitting: Internal Medicine

## 2020-01-10 DIAGNOSIS — Z1231 Encounter for screening mammogram for malignant neoplasm of breast: Secondary | ICD-10-CM | POA: Insufficient documentation

## 2020-01-12 ENCOUNTER — Other Ambulatory Visit: Payer: Self-pay | Admitting: Internal Medicine

## 2020-01-12 DIAGNOSIS — R921 Mammographic calcification found on diagnostic imaging of breast: Secondary | ICD-10-CM

## 2020-01-12 DIAGNOSIS — R928 Other abnormal and inconclusive findings on diagnostic imaging of breast: Secondary | ICD-10-CM

## 2020-01-19 MED FILL — AMLODIPINE BESYLATE 5 MG TA: 5 | 30 days supply | Qty: 30 | Fill #0

## 2020-01-20 ENCOUNTER — Ambulatory Visit
Admission: RE | Admit: 2020-01-20 | Discharge: 2020-01-20 | Disposition: A | Discharge status: Home / Self Care | Payer: Medicare Other | Source: Ambulatory Visit | Attending: Internal Medicine | Admitting: Internal Medicine

## 2020-01-20 DIAGNOSIS — R928 Other abnormal and inconclusive findings on diagnostic imaging of breast: Secondary | ICD-10-CM | POA: Insufficient documentation

## 2020-01-20 DIAGNOSIS — R921 Mammographic calcification found on diagnostic imaging of breast: Secondary | ICD-10-CM

## 2020-01-26 ENCOUNTER — Other Ambulatory Visit: Payer: Self-pay | Admitting: Internal Medicine

## 2020-01-26 DIAGNOSIS — R921 Mammographic calcification found on diagnostic imaging of breast: Secondary | ICD-10-CM

## 2020-01-26 DIAGNOSIS — R928 Other abnormal and inconclusive findings on diagnostic imaging of breast: Secondary | ICD-10-CM

## 2020-02-01 ENCOUNTER — Other Ambulatory Visit: Payer: Self-pay | Admitting: Internal Medicine

## 2020-02-01 ENCOUNTER — Ambulatory Visit
Admission: RE | Admit: 2020-02-01 | Discharge: 2020-02-01 | Disposition: A | Discharge status: Home / Self Care | Payer: Medicare Other | Source: Ambulatory Visit | Attending: Internal Medicine | Admitting: Internal Medicine

## 2020-02-01 ENCOUNTER — Other Ambulatory Visit: Payer: Self-pay

## 2020-02-01 DIAGNOSIS — R928 Other abnormal and inconclusive findings on diagnostic imaging of breast: Secondary | ICD-10-CM

## 2020-02-01 DIAGNOSIS — R921 Mammographic calcification found on diagnostic imaging of breast: Secondary | ICD-10-CM

## 2020-02-01 HISTORY — PX: BREAST BIOPSY: SHX20

## 2020-02-01 MED FILL — ESZOPICLONE 3 MG TABS: 3 | 30 days supply | Qty: 30 | Fill #1

## 2020-02-01 MED FILL — tiZANidine HCL 4 MG TABS: 4 | 30 days supply | Qty: 90 | Fill #0

## 2020-02-01 MED FILL — SPIRONOLACTONE 25 MG TABS: 25 | 90 days supply | Qty: 90 | Fill #0

## 2020-02-02 MED FILL — ENALAPRIL MALEATE 10 MG TAB: 10 | 30 days supply | Qty: 30 | Fill #2

## 2020-02-03 DIAGNOSIS — D0512 Intraductal carcinoma in situ of left breast: Secondary | ICD-10-CM

## 2020-02-03 NOTE — Progress Notes (Signed)
Received pathology results, and request from [facility] to initiate navigation.  Notified Dr. Caryl Comes. Patient is scheduled for surgical consult on 02/06/20.  Scheduled Med/Onc consult with Dr. Rogue Bussing on 02/07/20. Plan to accompany patient to Med/Onc visit.

## 2020-02-06 ENCOUNTER — Other Ambulatory Visit: Payer: Self-pay | Admitting: General Surgery

## 2020-02-06 DIAGNOSIS — D0512 Intraductal carcinoma in situ of left breast: Secondary | ICD-10-CM

## 2020-02-06 NOTE — Progress Notes (Signed)
consult

## 2020-02-07 ENCOUNTER — Other Ambulatory Visit: Payer: Self-pay

## 2020-02-07 ENCOUNTER — Encounter: Payer: Self-pay | Admitting: Internal Medicine

## 2020-02-07 ENCOUNTER — Inpatient Hospital Stay: Payer: Medicare Other | Attending: Internal Medicine | Admitting: Internal Medicine

## 2020-02-07 ENCOUNTER — Inpatient Hospital Stay: Payer: Medicare Other

## 2020-02-07 VITALS — BP 149/82 | HR 85 | Temp 98.2°F | Wt 195.5 lb

## 2020-02-07 DIAGNOSIS — I10 Essential (primary) hypertension: Secondary | ICD-10-CM | POA: Diagnosis not present

## 2020-02-07 DIAGNOSIS — Z79899 Other long term (current) drug therapy: Secondary | ICD-10-CM | POA: Diagnosis not present

## 2020-02-07 DIAGNOSIS — E039 Hypothyroidism, unspecified: Secondary | ICD-10-CM

## 2020-02-07 DIAGNOSIS — D0512 Intraductal carcinoma in situ of left breast: Secondary | ICD-10-CM | POA: Insufficient documentation

## 2020-02-07 NOTE — Progress Notes (Signed)
Supported patient , and her sister at initial Med/Onc visit.  She met with Dr. Bary Castilla this morning. Plans for mastectomy with immediate reconstruction.  She has appointment with plastic surgeon later this week.  Dr. Rogue Bussing explained post surgical treatment plan which will be dependent on final pathology. She notify navigator when surgery is scheduled.

## 2020-02-07 NOTE — Assessment & Plan Note (Addendum)
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  #Agree with mastectomy/also patient preference; along with sentinel lymph node biopsy-given the presence of comedonecrosis.  Discussed that if no surprises on final pathology there would be no role for radiation; or chemotherapy.  Discussed the role of chemoprevention-with endocrine therapy-if patient has ER positive disease.  Since patient will have mastectomy-role of chemoprevention would be to cut down the risk on the contralateral breast.  Would not recommend prophylactic contralateral mastectomy.  #Risk factors-discussed including gender; age; question use of hormone replacement therapy.  Patient given lack of family history-not at high risk for genetic predisposition.   Thank you Dr.Byrnett for allowing me to participate in the care of your pleasant patient. Please do not hesitate to contact me with questions or concerns in the interim.  Discussed with Dr. Tollie Pizza.  # DISPOSITION: # follow up 2 weeks post surgery-Dr.B

## 2020-02-07 NOTE — Progress Notes (Signed)
[facility] [place] NOTE  Patient Care Team: Adin Hector, MD as PCP - General (Internal Medicine) Bary Castilla, Forest Gleason, MD (General Surgery) Theodore Demark, RN as Oncology Nurse Navigator Cammie Sickle, MD as Consulting Physician (Internal Medicine)  CHIEF COMPLAINTS/PURPOSE OF CONSULTATION: Breast cancer   Oncology History Overview Note  # MAY 2021- LEFT BREAST DICS- [Dr.Byrnett]:  A.  BREAST CALCIFICATIONS, LEFT #1 LATERAL; STEREOTACTIC BIOPSY: - HIGH-GRADE DUCTAL CARCINOMA IN SITU (DCIS), COMEDO TYPE.  - COLUMNAR CELL CHANGE WITH HYPERPLASIA, CYSTIC PAPILLARY APOCRINE  METAPLASIA, USUAL DUCTAL HYPERPLASIA, ATYPICAL LOBULAR HYPERPLASIA, AND  DUCT ECTASIA.  - CALCIFICATIONS ASSOCIATED WITH DCIS AND BENIGN MAMMARY EPITHELIUM.  - DEEPER SECTIONS EXAMINED.   B.  BREAST CALCIFICATIONS, LEFT #2 LOWER OUTER; STEREOTACTIC BIOPSY:  - CYSTIC PAPILLARY APOCRINE METAPLASIA WITH ASSOCIATED CALCIFICATIONS.  - NEGATIVE FOR ATYPIA AND MALIGNANCY.  - DEEPER SECTIONS EXAMINED.    Ductal carcinoma in situ (DCIS) of left breast  02/07/2020 Initial Diagnosis   Ductal carcinoma in situ (DCIS) of left breast      HISTORY OF PRESENTING ILLNESS:  Margaret Farmer 64 y.o.  female female with no prior history of breast cancer/or malignancies has been referred to [place] for further evaluation recommendations for new diagnosis of left breast DCIS.   Patient states she was found to have an abnormal screening mammogram which led to diagnostic mammogram/ultrasound/followed by biopsy-as summarized above.  Otherwise patient is in good health.  Denies any previous breast biopsies.  Family history of breast cancer: None Family history of other cancers: Father kidney cancer in the 57s   Used estrogen: started Estrogen replacement 2019/ bone health   Review of Systems  Constitutional: Negative for chills, diaphoresis, fever, malaise/fatigue and weight loss.  HENT: Negative for  nosebleeds and sore throat.   Eyes: Negative for double vision.  Respiratory: Negative for cough, hemoptysis, sputum production, shortness of breath and wheezing.   Cardiovascular: Negative for chest pain, palpitations, orthopnea and leg swelling.  Gastrointestinal: Negative for abdominal pain, blood in stool, constipation, diarrhea, heartburn, melena, nausea and vomiting.  Genitourinary: Negative for dysuria, frequency and urgency.  Musculoskeletal: Negative for back pain and joint pain.  Skin: Negative.  Negative for itching and rash.  Neurological: Negative for dizziness, tingling, focal weakness, weakness and headaches.  Endo/Heme/Allergies: Does not bruise/bleed easily.  Psychiatric/Behavioral: Negative for depression. The patient is not nervous/anxious and does not have insomnia.      MEDICAL HISTORY:  Past Medical History:  Diagnosis Date  . Hypertension   . Hypothyroid     SURGICAL HISTORY: Past Surgical History:  Procedure Laterality Date  . BREAST BIOPSY Left 02/01/2020   affirm bx of calcs, coil marker, path pending lateral   . BREAST BIOPSY Left 02/01/2020   affirm bx of calcs, x clip, path pending lower outer  . COLONOSCOPY  2013  . VEIN SURGERY Left 1986    SOCIAL HISTORY: Social History   Socioeconomic History  . Marital status: Single    Spouse name: Not on file  . Number of children: Not on file  . Years of education: Not on file  . Highest education level: Not on file  Occupational History  . Not on file  Tobacco Use  . Smoking status: Never Smoker  . Smokeless tobacco: Never Used  Substance and Sexual Activity  . Alcohol use: Yes    Comment: rare  . Drug use: No  . Sexual activity: Not on file  Other Topics Concern  .  Not on file  Social History Narrative   retd [employer]; lives in [place]; no smoking/alcohol.    Social Determinants of Health   Financial Resource Strain:   . Difficulty of Paying Living Expenses:   Food Insecurity:   .  Worried About Charity fundraiser in the Last Year:   . Arboriculturist in the Last Year:   Transportation Needs:   . Film/video editor (Medical):   Marland Kitchen Lack of Transportation (Non-Medical):   Physical Activity:   . Days of Exercise per Week:   . Minutes of Exercise per Session:   Stress:   . Feeling of Stress :   Social Connections:   . Frequency of Communication with Friends and Family:   . Frequency of Social Gatherings with Friends and Family:   . Attends Religious Services:   . Active Member of Clubs or Organizations:   . Attends Archivist Meetings:   Marland Kitchen Marital Status:   Intimate Partner Violence:   . Fear of Current or Ex-Partner:   . Emotionally Abused:   Marland Kitchen Physically Abused:   . Sexually Abused:     FAMILY HISTORY: Family History  Problem Relation Age of Onset  . Kidney cancer Father 17  . Breast cancer Neg Hx     ALLERGIES:  has No Known Allergies.  MEDICATIONS:  Current Outpatient Medications  Medication Sig Dispense Refill  . ALPRAZolam (XANAX) 0.25 MG tablet   4  . amLODipine (NORVASC) 5 MG tablet Take 5 mg by mouth daily.    . ciclesonide (OMNARIS) 50 MCG/ACT nasal spray Place into the nose.    . enalapril (VASOTEC) 10 MG tablet TAKE 1 TABLET BY MOUTH ONCE DAILY 30 tablet 5  . Eszopiclone 3 MG TABS   5  . promethazine (PHENERGAN) 25 MG tablet Take 25 mg by mouth every 6 (six) hours as needed.   12  . spironolactone (ALDACTONE) 25 MG tablet   12  . SYNTHROID 150 MCG tablet 137 mcg.   11  . tiZANidine (ZANAFLEX) 4 MG capsule Take 4 mg by mouth 3 (three) times daily as needed for muscle spasms.    Marland Kitchen zolpidem (AMBIEN) 5 MG tablet Take 5 mg by mouth.    . celecoxib (CELEBREX) 100 MG capsule Take by mouth.    . Liotrix 15 (3.1-12.5) MG (MCG) TABS Take 1 capsule by mouth daily.  0  . MAGNESIUM PO Take 150 mg by mouth.     No current facility-administered medications for this visit.      Marland Kitchen  PHYSICAL EXAMINATION: ECOG PERFORMANCE STATUS: 0 -  Asymptomatic  Vitals:   02/07/20 1113  BP: (!) 149/82  Pulse: 85  Temp: 98.2 F (36.8 C)   Filed Weights   02/07/20 1113  Weight: 195 lb 8 oz (88.7 kg)    Physical Exam  Constitutional: She is oriented to person, place, and time and well-developed, well-nourished, and in no distress.  HENT:  Head: Normocephalic and atraumatic.  Mouth/Throat: Oropharynx is clear and moist. No oropharyngeal exudate.  Eyes: Pupils are equal, round, and reactive to light.  Cardiovascular: Normal rate and regular rhythm.  Pulmonary/Chest: Effort normal and breath sounds normal. No respiratory distress. She has no wheezes.  Abdominal: Soft. Bowel sounds are normal. She exhibits no distension and no mass. There is no abdominal tenderness. There is no rebound and no guarding.  Musculoskeletal:        General: No tenderness or edema. Normal range of motion.  Cervical back: Normal range of motion and neck supple.  Neurological: She is alert and oriented to person, place, and time.  Skin: Skin is warm.  Psychiatric: Affect normal.     LABORATORY DATA:  I have reviewed the data as listed No results found for: WBC, HGB, HCT, MCV, PLT No results for input(s): NA, K, CL, CO2, GLUCOSE, BUN, CREATININE, CALCIUM, GFRNONAA, GFRAA, PROT, ALBUMIN, AST, ALT, ALKPHOS, BILITOT, BILIDIR, IBILI in the last 8760 hours.  RADIOGRAPHIC STUDIES: I have personally reviewed the radiological images as listed and agreed with the findings in the report. MM Digital Diagnostic Unilat L  Result Date: 01/20/2020 CLINICAL DATA:  Left breast calcifications on a recent screening mammogram. EXAM: DIGITAL DIAGNOSTIC LEFT MAMMOGRAM WITH CAD COMPARISON:  Previous exam(s). ACR Breast Density Category b: There are scattered areas of fibroglandular density. FINDINGS: 3D tomographic and 2D generated true lateral and 2D spot magnification true lateral and craniocaudal images of the left breast were obtained. These demonstrate multiple small  groups of punctate, rounded and oval calcifications in the outer portion of the breast extending from approximately the 2 o'clock to the 4 o'clock position of the breast. These are most numerous in the central portion of the breast in the 3 o'clock position in a group spanning 5 mm. There is a small group more inferiorly and posteriorly, in the 4 o'clock position, that is arranged in a somewhat linear fashion, spanning 6 mm. The groups of calcifications span an area measuring 5.1 x 5.0 x 4.2 cm. Mammographic images were processed with CAD. IMPRESSION: Multiple groups of indeterminate calcifications in the outer left breast, as described above. RECOMMENDATION: 3D stereotactic guided core needle biopsy of the 5 mm group of calcifications in the 3 o'clock position of the left breast and 3D stereotactic guided core needle biopsy of the 6 mm group of calcifications in the 4 o'clock position of the left breast. This has been discussed with the patient and the biopsies will be scheduled in consultation with her referring provider. I have discussed the findings and recommendations with the patient. If applicable, a reminder letter will be sent to the patient regarding the next appointment. BI-RADS CATEGORY  4: Suspicious. Electronically Signed   By: Claudie Revering M.D.   On: 01/20/2020 16:06   MM 3D SCREEN BREAST BILATERAL  Result Date: 01/11/2020 CLINICAL DATA:  Screening. EXAM: DIGITAL SCREENING BILATERAL MAMMOGRAM WITH TOMO AND CAD COMPARISON:  Previous exam(s). ACR Breast Density Category b: There are scattered areas of fibroglandular density. FINDINGS: In the left breast, calcifications warrant further evaluation. In the right breast, no findings suspicious for malignancy. Images were processed with CAD. IMPRESSION: Further evaluation is suggested for calcifications in the left breast. RECOMMENDATION: Diagnostic mammogram of the left breast. (Code:[id number]-75M) The patient will be contacted regarding the findings, and  additional imaging will be scheduled. BI-RADS CATEGORY  0: Incomplete. Need additional imaging evaluation and/or prior mammograms for comparison. Electronically Signed   By: Kristopher Oppenheim M.D.   On: 01/11/2020 09:00   MM CLIP PLACEMENT LEFT  Result Date: 02/01/2020 CLINICAL DATA:  Post biopsy mammogram of the left breast for clip placement. EXAM: DIAGNOSTIC LEFT MAMMOGRAM POST STEREOTACTIC BIOPSY COMPARISON:  Previous exam(s). FINDINGS: Mammographic images were obtained following stereotactic guided biopsy of 2 groups of calcifications in the left breast. The biopsy marking clips are in expected position at the sites of biopsy. IMPRESSION: Appropriate positioning of the coil shaped biopsy marking clip at the site of biopsy in the lateral left breast. Appropriate  positioning of the X shaped biopsy marking clip in the inferior left breast. Final Assessment: Post Procedure Mammograms for Marker Placement Electronically Signed   By: Ammie Ferrier M.D.   On: 02/01/2020 09:32   MM LT BREAST BX W LOC DEV 1ST LESION IMAGE BX SPEC STEREO GUIDE  Addendum Date: 02/03/2020   ADDENDUM REPORT: 02/03/2020 12:34 ADDENDUM: PATHOLOGY revealed: A. BREAST CALCIFICATIONS, LEFT #1 LATERAL; STEREOTACTIC BIOPSY: - HIGH-GRADE DUCTAL CARCINOMA IN SITU (DCIS), COMEDO TYPE. - COLUMNAR CELL CHANGE WITH HYPERPLASIA, CYSTIC PAPILLARY APOCRINE METAPLASIA, USUAL DUCTAL HYPERPLASIA, ATYPICAL LOBULAR HYPERPLASIA, AND DUCT ECTASIA. - CALCIFICATIONS ASSOCIATED WITH DCIS AND BENIGN MAMMARY EPITHELIUM. - DEEPER SECTIONS EXAMINED. B. BREAST CALCIFICATIONS, LEFT #2 LOWER OUTER; STEREOTACTIC BIOPSY: - CYSTIC PAPILLARY APOCRINE METAPLASIA WITH ASSOCIATED CALCIFICATIONS. - NEGATIVE FOR ATYPIA AND MALIGNANCY. - DEEPER SECTIONS EXAMINED. Comment: In part A DCIS is present in 1 (tissue submitted in section E) of 6 blocks and measures 2 mm. Immunohistochemistry for ER will be deferred to the excision specimen. Pathology results are CONCORDANT with  imaging findings, per Dr. Ammie Ferrier. Pathology results and recommendations below were discussed with patient by telephone on 02/03/2020. Patient reported biopsy site doing well with slight tenderness at the site. Post biopsy care instructions were reviewed and questions were answered. Patient was instructed to call [facility] if any concerns or questions arise related to the biopsy. Due to voice messages left for patient with no return call, I contacted provider office (Dr. Ramonita Lab) on 02/03/2020 and requested they also attempt to contact patient regarding biopsy results. Recommendation: 1. Recommend additional LEFT breast stereotactic needle core biopsy of anterior calcifications to determine extent of disease. 2.  Bilateral breast MRI to determine extent of disease. 3. Surgical referral. Patient has scheduled appointment with surgeon (Dr. Ollen Bowl) Monday 02/06/2020. Al Pimple RN and Tanya Nones RN at [facility] were notified by Electa Sniff RN on 02/03/2020. Addendum by Electa Sniff RN on 02/03/2020. Electronically Signed   By: Ammie Ferrier M.D.   On: 02/03/2020 12:34   Result Date: 02/03/2020 CLINICAL DATA:  64 year old female presenting for stereotactic biopsy of 2 groups of left breast calcifications. EXAM: LEFT BREAST STEREOTACTIC CORE NEEDLE BIOPSY COMPARISON:  Previous exams. FINDINGS: The patient and I discussed the procedure of stereotactic-guided biopsy including benefits and alternatives. We discussed the high likelihood of a successful procedure. We discussed the risks of the procedure including infection, bleeding, tissue injury, clip migration, and inadequate sampling. Informed written consent was given. The usual time out protocol was performed immediately prior to the procedure. Lesion quadrant: Upper outer quadrant Using sterile technique and 1% Lidocaine as local anesthetic, under stereotactic guidance, a 9 gauge vacuum assisted device was used  to perform core needle biopsy of calcifications in the lateral left breast using a lateral approach. Specimen radiograph was performed showing calcifications in multiple core samples. Specimens with calcifications are identified for pathology. At the conclusion of the procedure, coil tissue marker clip was deployed into the biopsy cavity. -------------------------------------- Lesion quadrant: Lower outer quadrant Using sterile technique and 1% Lidocaine as local anesthetic, under stereotactic guidance, a 9 gauge vacuum assisted device was used to perform core needle biopsy of calcifications in the lower outer quadrant of the left breast using a lateral approach. Specimen radiograph was performed showing calcifications in multiple core samples. Specimens with calcifications are identified for pathology. At the conclusion of the procedure, X shaped tissue marker clip was deployed into the biopsy cavity. Follow-up 2-view mammogram was performed  and dictated separately. IMPRESSION: 1. Stereotactic-guided biopsy of calcifications in the lateral left breast. No apparent complications. 2. Stereotactic-guided biopsy of calcifications in the lower outer left breast. No apparent complications. Electronically Signed: By: Ammie Ferrier M.D. On: 02/01/2020 08:51   MM LT BREAST BX W LOC DEV EA AD LESION IMG BX SPEC STEREO GUIDE  Addendum Date: 02/03/2020   ADDENDUM REPORT: 02/03/2020 12:34 ADDENDUM: PATHOLOGY revealed: A. BREAST CALCIFICATIONS, LEFT #1 LATERAL; STEREOTACTIC BIOPSY: - HIGH-GRADE DUCTAL CARCINOMA IN SITU (DCIS), COMEDO TYPE. - COLUMNAR CELL CHANGE WITH HYPERPLASIA, CYSTIC PAPILLARY APOCRINE METAPLASIA, USUAL DUCTAL HYPERPLASIA, ATYPICAL LOBULAR HYPERPLASIA, AND DUCT ECTASIA. - CALCIFICATIONS ASSOCIATED WITH DCIS AND BENIGN MAMMARY EPITHELIUM. - DEEPER SECTIONS EXAMINED. B. BREAST CALCIFICATIONS, LEFT #2 LOWER OUTER; STEREOTACTIC BIOPSY: - CYSTIC PAPILLARY APOCRINE METAPLASIA WITH ASSOCIATED CALCIFICATIONS.  - NEGATIVE FOR ATYPIA AND MALIGNANCY. - DEEPER SECTIONS EXAMINED. Comment: In part A DCIS is present in 1 (tissue submitted in section E) of 6 blocks and measures 2 mm. Immunohistochemistry for ER will be deferred to the excision specimen. Pathology results are CONCORDANT with imaging findings, per Dr. Ammie Ferrier. Pathology results and recommendations below were discussed with patient by telephone on 02/03/2020. Patient reported biopsy site doing well with slight tenderness at the site. Post biopsy care instructions were reviewed and questions were answered. Patient was instructed to call [facility] if any concerns or questions arise related to the biopsy. Due to voice messages left for patient with no return call, I contacted provider office (Dr. Ramonita Lab) on 02/03/2020 and requested they also attempt to contact patient regarding biopsy results. Recommendation: 1. Recommend additional LEFT breast stereotactic needle core biopsy of anterior calcifications to determine extent of disease. 2.  Bilateral breast MRI to determine extent of disease. 3. Surgical referral. Patient has scheduled appointment with surgeon (Dr. Ollen Bowl) Monday 02/06/2020. Al Pimple RN and Tanya Nones RN at [facility] were notified by Electa Sniff RN on 02/03/2020. Addendum by Electa Sniff RN on 02/03/2020. Electronically Signed   By: Ammie Ferrier M.D.   On: 02/03/2020 12:34   Result Date: 02/03/2020 CLINICAL DATA:  64 year old female presenting for stereotactic biopsy of 2 groups of left breast calcifications. EXAM: LEFT BREAST STEREOTACTIC CORE NEEDLE BIOPSY COMPARISON:  Previous exams. FINDINGS: The patient and I discussed the procedure of stereotactic-guided biopsy including benefits and alternatives. We discussed the high likelihood of a successful procedure. We discussed the risks of the procedure including infection, bleeding, tissue injury, clip migration, and inadequate sampling.  Informed written consent was given. The usual time out protocol was performed immediately prior to the procedure. Lesion quadrant: Upper outer quadrant Using sterile technique and 1% Lidocaine as local anesthetic, under stereotactic guidance, a 9 gauge vacuum assisted device was used to perform core needle biopsy of calcifications in the lateral left breast using a lateral approach. Specimen radiograph was performed showing calcifications in multiple core samples. Specimens with calcifications are identified for pathology. At the conclusion of the procedure, coil tissue marker clip was deployed into the biopsy cavity. -------------------------------------- Lesion quadrant: Lower outer quadrant Using sterile technique and 1% Lidocaine as local anesthetic, under stereotactic guidance, a 9 gauge vacuum assisted device was used to perform core needle biopsy of calcifications in the lower outer quadrant of the left breast using a lateral approach. Specimen radiograph was performed showing calcifications in multiple core samples. Specimens with calcifications are identified for pathology. At the conclusion of the procedure, X shaped tissue marker clip was deployed into the  biopsy cavity. Follow-up 2-view mammogram was performed and dictated separately. IMPRESSION: 1. Stereotactic-guided biopsy of calcifications in the lateral left breast. No apparent complications. 2. Stereotactic-guided biopsy of calcifications in the lower outer left breast. No apparent complications. Electronically Signed: By: Ammie Ferrier M.D. 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  #Agree with mastectomy/also patient preference; along with sentinel lymph node biopsy-given the presence of comedonecrosis.  Discussed that if no  surprises on final pathology there would be no role for radiation; or chemotherapy.  Discussed the role of chemoprevention-with endocrine therapy-if patient has ER positive disease.  Since patient will have mastectomy-role of chemoprevention would be to cut down the risk on the contralateral breast.  Would not recommend prophylactic contralateral mastectomy.  #Risk factors-discussed including gender; age; question use of hormone replacement therapy.  Patient given lack of family history-not at high risk for genetic predisposition.   Thank you Dr.Byrnett for allowing me to participate in the care of your pleasant patient. Please do not hesitate to contact me with questions or concerns in the interim.  Discussed with Dr. Tollie Pizza.  # DISPOSITION: # follow up 2 weeks post surgery-Dr.B  All questions were answered. The patient/family knows to call the clinic with any problems, questions or concerns.   Cammie Sickle, MD 02/07/2020 12:37 PM

## 2020-02-09 ENCOUNTER — Other Ambulatory Visit (HOSPITAL_COMMUNITY): Payer: Self-pay | Admitting: Podiatry

## 2020-02-09 ENCOUNTER — Encounter: Payer: Self-pay | Admitting: Plastic Surgery

## 2020-02-09 ENCOUNTER — Other Ambulatory Visit: Payer: Self-pay

## 2020-02-09 ENCOUNTER — Ambulatory Visit (INDEPENDENT_AMBULATORY_CARE_PROVIDER_SITE_OTHER): Payer: Medicare Other | Admitting: Plastic Surgery

## 2020-02-09 VITALS — BP 155/90 | HR 94 | Temp 98.2°F | Ht 67.0 in | Wt 180.0 lb

## 2020-02-09 DIAGNOSIS — I871 Compression of vein: Secondary | ICD-10-CM | POA: Diagnosis not present

## 2020-02-09 DIAGNOSIS — D0512 Intraductal carcinoma in situ of left breast: Secondary | ICD-10-CM | POA: Diagnosis not present

## 2020-02-09 MED FILL — GABAPENTIN 300 MG CAPSULE: 300 | 30 days supply | Qty: 60 | Fill #0

## 2020-02-10 ENCOUNTER — Institutional Professional Consult (permissible substitution): Payer: Medicare Other | Admitting: Plastic Surgery

## 2020-02-12 MED FILL — ENALAPRIL MALEATE 10 MG TAB: 10 | 30 days supply | Qty: 30 | Fill #2

## 2020-02-14 ENCOUNTER — Other Ambulatory Visit: Payer: Self-pay | Admitting: General Surgery

## 2020-02-14 ENCOUNTER — Encounter: Payer: Self-pay | Admitting: Plastic Surgery

## 2020-02-14 DIAGNOSIS — D0512 Intraductal carcinoma in situ of left breast: Secondary | ICD-10-CM

## 2020-02-14 MED FILL — AMLODIPINE BESYLATE 5 MG TA: 5 | 30 days supply | Qty: 30 | Fill #1

## 2020-02-14 NOTE — Progress Notes (Signed)
Patient ID: Margaret Farmer, female    DOB: 08-11-1956, 64 y.o.   MRN: [id number]   Chief Complaint  Patient presents with  . Breast Cancer    The patient is a 64 year old wf here for a consultation for breast reconstruction.  She had an abnormal screening mammogram diagnostic studies including ultrasound and biopsy.  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  She does not have a family history of breast cancer.  Her medical history includes hypertension, hypothyroid disease and May-Thurner syndrome.  She is 5 feet 7 inches tall and 180 pounds.  She has mild ptosis of her breasts as a preop cup size B.  She would like to be around about the same size.  She is planning on a left-sided mastectomy. She is not a smoker.   Review of Systems  Constitutional: Negative for activity change.  HENT: Negative.   Eyes: Negative.   Respiratory: Negative.  Negative for chest tightness.   Cardiovascular: Negative.   Gastrointestinal: Negative.   Endocrine: Negative.   Genitourinary: Negative.   Musculoskeletal: Negative.   Neurological: Negative.   Psychiatric/Behavioral: Negative.     Past Medical History:  Diagnosis Date  . Hypertension   . Hypothyroid     Past Surgical History:  Procedure Laterality Date  . BREAST BIOPSY Left 02/01/2020   affirm bx of calcs, coil marker, path pending lateral   . BREAST BIOPSY Left 02/01/2020   affirm bx of calcs, x clip, path pending lower outer  . COLONOSCOPY  2013  . VEIN SURGERY Left 1986      Current Outpatient Medications:  .  ALPRAZolam (XANAX) 0.25 MG tablet, , Disp: , Rfl: 4 .  amLODipine (NORVASC) 5 MG tablet, Take 5 mg by mouth daily., Disp: , Rfl:  .  celecoxib (CELEBREX) 100 MG capsule, Take by mouth., Disp: , Rfl:  .  ciclesonide (OMNARIS) 50 MCG/ACT nasal spray, Place into the nose., Disp: , Rfl:  .  enalapril (VASOTEC) 10 MG tablet, TAKE 1 TABLET BY MOUTH ONCE DAILY, Disp: 30 tablet, Rfl: 5 .  Eszopiclone 3 MG  TABS, , Disp: , Rfl: 5 .  Liotrix 15 (3.1-12.5) MG (MCG) TABS, Take 1 capsule by mouth daily., Disp: , Rfl: 0 .  MAGNESIUM PO, Take 150 mg by mouth., Disp: , Rfl:  .  promethazine (PHENERGAN) 25 MG tablet, Take 25 mg by mouth every 6 (six) hours as needed. , Disp: , Rfl: 12 .  spironolactone (ALDACTONE) 25 MG tablet, , Disp: , Rfl: 12 .  SYNTHROID 150 MCG tablet, 137 mcg. , Disp: , Rfl: 11 .  tiZANidine (ZANAFLEX) 4 MG capsule, Take 4 mg by mouth 3 (three) times daily as needed for muscle spasms., Disp: , Rfl:  .  zolpidem (AMBIEN) 5 MG tablet, Take 5 mg by mouth., Disp: , Rfl:    Objective:   Vitals:   02/09/20 0911  BP: (!) 155/90  Pulse: 94  Temp: 98.2 F (36.8 C)  SpO2: 97%    Physical Exam Vitals and nursing note reviewed.  Constitutional:      Appearance: Normal appearance.  HENT:     Head: Normocephalic and atraumatic.  Eyes:     Extraocular Movements: Extraocular movements intact.  Cardiovascular:     Rate and Rhythm: Normal rate.     Pulses: Normal pulses.  Pulmonary:     Effort: Pulmonary effort is normal.  Abdominal:     General: Abdomen is flat. There  is no distension.     Tenderness: There is no abdominal tenderness.  Skin:    General: Skin is warm.     Capillary Refill: Capillary refill takes less than 2 seconds.  Neurological:     General: No focal deficit present.     Mental Status: She is alert and oriented to person, place, and time.  Psychiatric:        Mood and Affect: Mood normal.        Behavior: Behavior normal.        Thought Content: Thought content normal.     Assessment & Plan:  Ductal carcinoma in situ (DCIS) of left breast  May-Thurner syndrome  We had a detailed conversation about the patient's options for breast reconstruction. Several reconstruction options were explained to the patient.  It is important to remember that breast reconstruction is an optional procedure. Reconstruction often requires several stages of surgery and  this means more than one operation.  The surgeries are often done several months apart.  The entire process from start to finish can take a year or more. The major goal of breast reconstruction is to look normal in clothing. There will always be scars and a difference noticeable without clothes.  This is true for asymmetries where both breasts will not be identical.  Surgery may be needed or desired to the non-cancerous breast in order to achieve better symmetry and satisfactory results.  Regardless of the reconstructive method, there is always risks and the possibility that the procedure will fail or have complications.  This couls required additional surgeries.    We discussed the available methods of breast reconstruction and included:  1. Tissue expander with Acellular dermal matrix followed by implant based reconstruction. This can be done as one surgery or multiple surgeries.  2. Autologous reconstruction can include using a muscle or tissue from another area of the body for the reconstruction.  3. Combined procedures like the latissismus dorsi flaps that often uses the muscle with an expander or implant.  For each of the method discussed the risks, benefits, scars and recovery time were discussed in detail. Specific risks included bleeding, infection, hematoma, seroma, scarring, pain, wound healing complications, flap loss, fat necrosis, capsular contracture, need for implant removal, donor site complications, bulge, hernia, umbilical necrosis, need for urgent reoperation, and need for dressing changes.   After the options were discussed we focused on the patient's desires and the procedure that was best for her based on all the information.  A total of 45 minutes of face-to-face time was spent in this encounter, of which >70% was spent in counseling.    She seems to have very realistic expectations.  She would like to have some form of breasts and has decided on Expander and FlexHD placement.   This is a good option for the patient. Due to the location of the surgery a lateral NAC incision is the best option with the hope of salvaging her NAC. She would also like it done in [place].  Pictures were obtained of the patient and placed in the chart with the patient's or guardian's permission.   [place], DO

## 2020-02-16 ENCOUNTER — Other Ambulatory Visit: Payer: Self-pay | Admitting: General Surgery

## 2020-02-16 MED FILL — ESZOPICLONE 3 MG TABS: 3 | 30 days supply | Qty: 30 | Fill #1

## 2020-02-16 MED FILL — tiZANidine HCL 4 MG TABS: 4 | 30 days supply | Qty: 90 | Fill #0

## 2020-02-16 MED FILL — SPIRONOLACTONE 25 MG TABS: 25 | 90 days supply | Qty: 90 | Fill #0

## 2020-02-16 MED FILL — ZOLPIDEM TARTRATE 5 MG TAB: 5 | 30 days supply | Qty: 15 | Fill #2

## 2020-02-16 MED FILL — MONTELUKAST SOD 10 MG TAB: 10 | 90 days supply | Qty: 90 | Fill #0

## 2020-02-17 MED FILL — PROMETHAZINE 25 MG TABLET: 25 | 7 days supply | Qty: 30 | Fill #3

## 2020-02-21 ENCOUNTER — Encounter: Payer: Self-pay | Admitting: Surgical

## 2020-02-21 ENCOUNTER — Other Ambulatory Visit: Payer: Self-pay

## 2020-02-21 ENCOUNTER — Ambulatory Visit (INDEPENDENT_AMBULATORY_CARE_PROVIDER_SITE_OTHER): Payer: Medicare Other | Admitting: Surgical

## 2020-02-21 VITALS — BP 134/79 | HR 90 | Temp 98.6°F | Wt 195.5 lb

## 2020-02-21 DIAGNOSIS — D0512 Intraductal carcinoma in situ of left breast: Secondary | ICD-10-CM

## 2020-02-21 MED ORDER — CEPHALEXIN 500 MG PO CAPS: 500.0000 mg | ORAL_CAPSULE | Freq: Four times a day (QID) | ORAL | 0 refills | Status: AC

## 2020-02-21 MED ORDER — HYDROCODONE-ACETAMINOPHEN 5-325 MG PO TABS: 1.0000 | ORAL_TABLET | Freq: Four times a day (QID) | ORAL | 0 refills | Status: AC | PRN

## 2020-02-21 MED ORDER — ONDANSETRON HCL 4 MG PO TABS: 4.0000 mg | ORAL_TABLET | Freq: Three times a day (TID) | ORAL | 0 refills | Status: DC | PRN

## 2020-02-21 MED ORDER — DIAZEPAM 2 MG PO TABS
2.0000 mg | ORAL_TABLET | Freq: Two times a day (BID) | ORAL | 0 refills | Status: DC | PRN
Start: 2020-02-21 — End: 2020-03-20
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
## 2020-02-28 ENCOUNTER — Telehealth: Payer: Self-pay | Admitting: *Deleted

## 2020-02-28 ENCOUNTER — Telehealth: Payer: Self-pay | Admitting: Surgical

## 2020-02-28 NOTE — Telephone Encounter (Signed)
Faxed Surgical Clearance to Dr. Jenene Slicker, w/ [facility].  Confirmation received.  Copy scanned to [facility] and copy to be scanned into the chart.  Awaiting response.//AB/CMA

## 2020-02-28 NOTE — Telephone Encounter (Signed)
Pre-op clearance form sent to [facility] for recommendations/input on need for post-op anticoagulation given stent placement for May-Thurner Syndrome in 2008.

## 2020-03-05 ENCOUNTER — Other Ambulatory Visit
Admission: RE | Admit: 2020-03-05 | Discharge: 2020-03-05 | Disposition: A | Discharge status: Home / Self Care | Payer: Medicare Other | Source: Ambulatory Visit | Attending: General Surgery | Admitting: General Surgery

## 2020-03-05 ENCOUNTER — Telehealth: Payer: Self-pay

## 2020-03-05 ENCOUNTER — Other Ambulatory Visit: Payer: Self-pay

## 2020-03-05 DIAGNOSIS — Z01818 Encounter for other preprocedural examination: Secondary | ICD-10-CM | POA: Insufficient documentation

## 2020-03-05 DIAGNOSIS — I10 Essential (primary) hypertension: Secondary | ICD-10-CM | POA: Insufficient documentation

## 2020-03-05 HISTORY — DX: Gastro-esophageal reflux disease without esophagitis: K21.9

## 2020-03-05 HISTORY — DX: Anxiety disorder, unspecified: F41.9

## 2020-03-05 HISTORY — DX: Depression, unspecified: F32.A

## 2020-03-05 NOTE — Telephone Encounter (Signed)
Called patient, [facility] surgery has been cleared by Dr. Arneta Cliche. Copy has been scanned to chart and Shayna. Dr. Marla Roe and Catalina Antigua have been notified as well.

## 2020-03-05 NOTE — Patient Instructions (Signed)
Your procedure is scheduled on: Wednesday 03/14/20  Report to [facility]. To find out your arrival time please call [phone] between 1PM - 3PM on Tuesday 03/13/20.   Remember: Instructions that are not followed completely may result in serious medical risk, up to and including death, or upon the discretion of your surgeon and anesthesiologist your surgery may need to be rescheduled.      _X__ 1. Do not eat food after midnight the night before your procedure.                 No gum chewing or hard candies. You may drink clear liquids up to 2 hours                 before you are scheduled to arrive for your surgery- DO NOT drink clear                 liquids within 2 hours of the start of your surgery.                 Clear Liquids include:  water, apple juice without pulp, clear carbohydrate                 drink such as Clearfast or Gatorade, Black Coffee or Tea (creamer or milk to coffee or tea).   __X__2.  On the morning of surgery brush your teeth with toothpaste and water, you may rinse your mouth with mouthwash if you wish.  Do not swallow any toothpaste or mouthwash.     __X__ 3.  No Alcohol for 24 hours before or after surgery.    __X__4.  Notify your doctor if there is any change in your medical condition      (cold, fever, infections).     Do not wear jewelry, make-up, hairpins, clips or nail polish. Do not wear lotions, powders, or perfumes.  Do not shave 48 hours prior to surgery. Men may shave face and neck. Do not bring valuables to the hospital.    [facility] is not responsible for any belongings or valuables.  Contacts, dentures/partials or body piercings may not be worn into surgery. Bring a case for your contacts, glasses or hearing aids, a denture cup will be supplied. Patients discharged the day of surgery will not be allowed to drive home.    __X__ Take these medicines the morning of surgery with A  SIP OF WATER:     1. amLODipine (NORVASC)   2. Liotrix   3. SYNTHROID  4. tiZANidine (ZANAFLEX) if needed  5. ALPRAZolam Duanne Moron) if needed  6. diazepam (VALIUM) if needed     __X__ Use CHG Soap as directed    __ X__Use inhalers on the day of surgery. Also bring the inhaler with you to the hospital on the morning of surgery.    __X__ Stop Anti-inflammatories 7 days before surgery such as Advil, Ibuprofen, Motrin, BC or Goodies Powder, Naprosyn, Naproxen, Aleve, Aspirin, Meloxicam. May take Tylenol if needed for pain or discomfort.    __X__ Don't start taking any new herbal supplements or vitamins prior to your surgery.

## 2020-03-06 ENCOUNTER — Other Ambulatory Visit: Payer: Medicare Other

## 2020-03-07 MED FILL — ENALAPRIL MALEATE 10 MG TAB: 10 | 30 days supply | Qty: 30 | Fill #3

## 2020-03-08 NOTE — Telephone Encounter (Signed)
Received Surgical Clearance back from Dr. Noralee Chars.  Copy scanned into the chart and also to [facility].    Dr. Marla Roe, [facility], and patient notified.-Per Tracey.//AB/CMA

## 2020-03-12 ENCOUNTER — Other Ambulatory Visit: Payer: Self-pay

## 2020-03-12 ENCOUNTER — Other Ambulatory Visit
Admission: RE | Admit: 2020-03-12 | Discharge: 2020-03-12 | Disposition: A | Discharge status: Home / Self Care | Payer: Medicare Other | Source: Ambulatory Visit | Attending: General Surgery | Admitting: General Surgery

## 2020-03-12 DIAGNOSIS — Z01812 Encounter for preprocedural laboratory examination: Secondary | ICD-10-CM | POA: Insufficient documentation

## 2020-03-12 DIAGNOSIS — Z20822 Contact with and (suspected) exposure to covid-19: Secondary | ICD-10-CM | POA: Insufficient documentation

## 2020-03-12 LAB — SARS CORONAVIRUS 2 (TAT 6-24 HRS): SARS Coronavirus 2: NEGATIVE

## 2020-03-13 MED FILL — AMLODIPINE BESYLATE 5 MG TA: 5 | 30 days supply | Qty: 30 | Fill #2

## 2020-03-14 ENCOUNTER — Ambulatory Visit
Admission: RE | Admit: 2020-03-14 | Discharge: 2020-03-14 | Disposition: A | Discharge status: Home / Self Care | Payer: Medicare Other | Attending: General Surgery | Admitting: General Surgery

## 2020-03-14 ENCOUNTER — Ambulatory Visit: Payer: Medicare Other

## 2020-03-14 ENCOUNTER — Encounter: Payer: Self-pay | Admitting: General Surgery

## 2020-03-14 ENCOUNTER — Ambulatory Visit: Payer: Medicare Other | Admitting: Urgent Care

## 2020-03-14 ENCOUNTER — Encounter
Admission: RE | Disposition: A | Discharge status: Home / Self Care | Payer: Self-pay | Source: Home / Self Care | Attending: General Surgery

## 2020-03-14 ENCOUNTER — Encounter
Admission: RE | Admit: 2020-03-14 | Discharge: 2020-03-14 | Disposition: A | Discharge status: Home / Self Care | Payer: Medicare Other | Source: Ambulatory Visit | Attending: General Surgery | Admitting: General Surgery

## 2020-03-14 ENCOUNTER — Other Ambulatory Visit: Payer: Self-pay

## 2020-03-14 DIAGNOSIS — D0512 Intraductal carcinoma in situ of left breast: Secondary | ICD-10-CM

## 2020-03-14 DIAGNOSIS — Z79899 Other long term (current) drug therapy: Secondary | ICD-10-CM | POA: Insufficient documentation

## 2020-03-14 DIAGNOSIS — I10 Essential (primary) hypertension: Secondary | ICD-10-CM | POA: Diagnosis not present

## 2020-03-14 DIAGNOSIS — Z79890 Hormone replacement therapy (postmenopausal): Secondary | ICD-10-CM | POA: Insufficient documentation

## 2020-03-14 DIAGNOSIS — F419 Anxiety disorder, unspecified: Secondary | ICD-10-CM | POA: Diagnosis not present

## 2020-03-14 DIAGNOSIS — E039 Hypothyroidism, unspecified: Secondary | ICD-10-CM | POA: Insufficient documentation

## 2020-03-14 DIAGNOSIS — F329 Major depressive disorder, single episode, unspecified: Secondary | ICD-10-CM | POA: Diagnosis not present

## 2020-03-14 HISTORY — PX: BREAST RECONSTRUCTION WITH PLACEMENT OF TISSUE EXPANDER AND FLEX HD (ACELLULAR HYDRATED DERMIS): SHX6295

## 2020-03-14 HISTORY — PX: MASTECTOMY W/ SENTINEL NODE BIOPSY: SHX2001

## 2020-03-14 SURGERY — MASTECTOMY WITH SENTINEL LYMPH NODE BIOPSY
Anesthesia: General | Laterality: Left

## 2020-03-14 MED ORDER — ORAL CARE MOUTH RINSE: 15.0000 mL | Freq: Once | OROMUCOSAL | Status: AC

## 2020-03-14 MED ORDER — METHYLENE BLUE 0.5 % INJ SOLN
INTRAVENOUS | Status: AC
  Filled 2020-03-14: qty 10

## 2020-03-14 MED ORDER — LIDOCAINE HCL (CARDIAC) PF 100 MG/5ML IV SOSY
PREFILLED_SYRINGE | INTRAVENOUS | Status: DC | PRN
  Administered 2020-03-14: 100 mg via INTRAVENOUS

## 2020-03-14 MED ORDER — FENTANYL CITRATE (PF) 100 MCG/2ML IJ SOLN
INTRAMUSCULAR | Status: AC
  Filled 2020-03-14: qty 2

## 2020-03-14 MED ORDER — ACETAMINOPHEN 10 MG/ML IV SOLN: 1000.0000 mg | Freq: Once | INTRAVENOUS | Status: DC | PRN

## 2020-03-14 MED ORDER — SODIUM CHLORIDE 0.9 % IV SOLN
INTRAVENOUS | Status: DC | PRN
  Administered 2020-03-14: 40 ug/min via INTRAVENOUS

## 2020-03-14 MED ORDER — OXYCODONE HCL 5 MG/5ML PO SOLN: 5.0000 mg | Freq: Once | ORAL | Status: AC | PRN

## 2020-03-14 MED ORDER — DEXMEDETOMIDINE HCL IN NACL 400 MCG/100ML IV SOLN
INTRAVENOUS | Status: DC | PRN
Start: 2020-03-14 — End: 2020-03-14
  Administered 2020-03-14: 4 ug via INTRAVENOUS

## 2020-03-14 MED ORDER — FENTANYL CITRATE (PF) 100 MCG/2ML IJ SOLN
25.0000 ug | INTRAMUSCULAR | Status: AC | PRN
  Administered 2020-03-14 (×6): 25 ug via INTRAVENOUS

## 2020-03-14 MED ORDER — ONDANSETRON HCL 4 MG/2ML IJ SOLN
INTRAMUSCULAR | Status: DC | PRN
  Administered 2020-03-14 (×2): 4 mg via INTRAVENOUS

## 2020-03-14 MED ORDER — OXYCODONE HCL 5 MG PO TABS
ORAL_TABLET | ORAL | Status: AC
  Filled 2020-03-14: qty 1

## 2020-03-14 MED ORDER — SODIUM CHLORIDE 0.9 % IV SOLN
INTRAVENOUS | Status: DC | PRN
  Administered 2020-03-14: 25 mL

## 2020-03-14 MED ORDER — CEFAZOLIN SODIUM-DEXTROSE 2-4 GM/100ML-% IV SOLN
INTRAVENOUS | Status: AC
  Filled 2020-03-14: qty 100

## 2020-03-14 MED ORDER — DEXAMETHASONE SODIUM PHOSPHATE 10 MG/ML IJ SOLN
INTRAMUSCULAR | Status: DC | PRN
  Administered 2020-03-14: 10 mg via INTRAVENOUS

## 2020-03-14 MED ORDER — ACETAMINOPHEN 10 MG/ML IV SOLN
INTRAVENOUS | Status: AC
  Filled 2020-03-14: qty 100

## 2020-03-14 MED ORDER — CEFAZOLIN SODIUM-DEXTROSE 2-4 GM/100ML-% IV SOLN
2.0000 g | INTRAVENOUS | Status: AC
  Administered 2020-03-14: 2 g via INTRAVENOUS

## 2020-03-14 MED ORDER — NEOMYCIN-POLYMYXIN B GU 40-200000 IR SOLN
Status: AC
  Filled 2020-03-14: qty 6

## 2020-03-14 MED ORDER — FAMOTIDINE 20 MG PO TABS: 20.0000 mg | ORAL_TABLET | Freq: Once | ORAL | Status: AC

## 2020-03-14 MED ORDER — SODIUM CHLORIDE FLUSH 0.9 % IV SOLN
INTRAVENOUS | Status: AC
  Filled 2020-03-14: qty 10

## 2020-03-14 MED ORDER — FENTANYL CITRATE (PF) 100 MCG/2ML IJ SOLN
INTRAMUSCULAR | Status: DC | PRN
  Administered 2020-03-14: 50 ug via INTRAVENOUS
  Administered 2020-03-14: 25 ug via INTRAVENOUS
  Administered 2020-03-14: 50 ug via INTRAVENOUS

## 2020-03-14 MED ORDER — LIDOCAINE HCL (PF) 2 % IJ SOLN
INTRAMUSCULAR | Status: AC
  Filled 2020-03-14: qty 5

## 2020-03-14 MED ORDER — PROPOFOL 10 MG/ML IV BOLUS
INTRAVENOUS | Status: DC | PRN
  Administered 2020-03-14: 120 mg via INTRAVENOUS

## 2020-03-14 MED ORDER — CHLORHEXIDINE GLUCONATE 0.12 % MT SOLN
OROMUCOSAL | Status: AC
  Administered 2020-03-14: 15 mL via OROMUCOSAL
  Filled 2020-03-14: qty 15

## 2020-03-14 MED ORDER — BACITRACIN 50000 UNITS IM SOLR
INTRAMUSCULAR | Status: AC
  Filled 2020-03-14: qty 1

## 2020-03-14 MED ORDER — CHLORHEXIDINE GLUCONATE 0.12 % MT SOLN: 15.0000 mL | Freq: Once | OROMUCOSAL | Status: AC

## 2020-03-14 MED ORDER — ONDANSETRON HCL 4 MG/2ML IJ SOLN: 4.0000 mg | Freq: Once | INTRAMUSCULAR | Status: DC | PRN

## 2020-03-14 MED ORDER — BUPIVACAINE-EPINEPHRINE 0.25% -1:200000 IJ SOLN
INTRAMUSCULAR | Status: DC | PRN
  Administered 2020-03-14: 10 mL

## 2020-03-14 MED ORDER — MIDAZOLAM HCL 2 MG/2ML IJ SOLN
INTRAMUSCULAR | Status: DC | PRN
  Administered 2020-03-14: 2 mg via INTRAVENOUS

## 2020-03-14 MED ORDER — LACTATED RINGERS IV SOLN: INTRAVENOUS | Status: DC

## 2020-03-14 MED ORDER — BUPIVACAINE-EPINEPHRINE (PF) 0.25% -1:200000 IJ SOLN
INTRAMUSCULAR | Status: AC
  Filled 2020-03-14: qty 60

## 2020-03-14 MED ORDER — ROCURONIUM BROMIDE 100 MG/10ML IV SOLN
INTRAVENOUS | Status: DC | PRN
  Administered 2020-03-14: 10 mg via INTRAVENOUS

## 2020-03-14 MED ORDER — PROPOFOL 10 MG/ML IV BOLUS
INTRAVENOUS | Status: AC
  Filled 2020-03-14: qty 20

## 2020-03-14 MED ORDER — TECHNETIUM TC 99M SULFUR COLLOID FILTERED
0.9000 | Freq: Once | INTRAVENOUS | Status: AC | PRN
  Administered 2020-03-14: 0.9 via INTRADERMAL

## 2020-03-14 MED ORDER — SODIUM CHLORIDE 0.9 % IR SOLN
Status: DC | PRN
  Administered 2020-03-14: 200 mL

## 2020-03-14 MED ORDER — MIDAZOLAM HCL 2 MG/2ML IJ SOLN
INTRAMUSCULAR | Status: AC
  Filled 2020-03-14: qty 2

## 2020-03-14 MED ORDER — ACETAMINOPHEN 10 MG/ML IV SOLN
INTRAVENOUS | Status: DC | PRN
  Administered 2020-03-14: 1000 mg via INTRAVENOUS

## 2020-03-14 MED ORDER — DEXAMETHASONE SODIUM PHOSPHATE 10 MG/ML IJ SOLN
INTRAMUSCULAR | Status: AC
  Filled 2020-03-14: qty 1

## 2020-03-14 MED ORDER — SUCCINYLCHOLINE CHLORIDE 20 MG/ML IJ SOLN
INTRAMUSCULAR | Status: DC | PRN
  Administered 2020-03-14: 110 mg via INTRAVENOUS

## 2020-03-14 MED ORDER — OXYCODONE HCL 5 MG PO TABS
5.0000 mg | ORAL_TABLET | Freq: Once | ORAL | Status: AC | PRN
  Administered 2020-03-14: 5 mg via ORAL

## 2020-03-14 MED ORDER — FAMOTIDINE 20 MG PO TABS
ORAL_TABLET | ORAL | Status: AC
  Administered 2020-03-14: 20 mg via ORAL
  Filled 2020-03-14: qty 1

## 2020-03-14 MED FILL — SYNTHROID 137 MCG TABLET: 137 | 90 days supply | Qty: 90 | Fill #4

## 2020-03-14 SURGICAL SUPPLY — 98 items
APPLIER CLIP 11 MED OPEN (CLIP)
APPLIER CLIP 13 LRG OPEN (CLIP)
BAG DECANTER FOR FLEXI CONT (MISCELLANEOUS) ×2 IMPLANT
BINDER BREAST LRG (GAUZE/BANDAGES/DRESSINGS) ×1 IMPLANT
BINDER BREAST MEDIUM (GAUZE/BANDAGES/DRESSINGS) IMPLANT
BINDER BREAST XLRG (GAUZE/BANDAGES/DRESSINGS) IMPLANT
BINDER BREAST XXLRG (GAUZE/BANDAGES/DRESSINGS) IMPLANT
BIOPATCH WHT 1IN DISK W/4.0 H (GAUZE/BANDAGES/DRESSINGS) ×3 IMPLANT
BLADE BOVIE TIP EXT 4 (BLADE) ×2 IMPLANT
BLADE PHOTON ILLUMINATED (MISCELLANEOUS) ×1 IMPLANT
BLADE SURG 15 STRL LF DISP TIS (BLADE) ×1 IMPLANT
BLADE SURG 15 STRL SS (BLADE) ×1
BLADE SURG 15 STRL SS SAFETY (BLADE) ×2 IMPLANT
BNDG GAUZE 4.5X4.1 6PLY STRL (MISCELLANEOUS) ×4 IMPLANT
BULB RESERV EVAC DRAIN JP 100C (MISCELLANEOUS) ×3 IMPLANT
CANISTER SUCT 1200ML W/VALVE (MISCELLANEOUS) ×4 IMPLANT
CHLORAPREP W/TINT 26 (MISCELLANEOUS) ×2 IMPLANT
CLIP APPLIE 11 MED OPEN (CLIP) IMPLANT
CLIP APPLIE 13 LRG OPEN (CLIP) IMPLANT
CNTNR SPEC 2.5X3XGRAD LEK (MISCELLANEOUS)
CONT SPEC 4OZ STER OR WHT (MISCELLANEOUS)
CONTAINER SPEC 2.5X3XGRAD LEK (MISCELLANEOUS) ×3 IMPLANT
COVER WAND RF STERILE (DRAPES) ×2 IMPLANT
DECANTER SPIKE VIAL GLASS SM (MISCELLANEOUS) IMPLANT
DERMABOND ADVANCED (GAUZE/BANDAGES/DRESSINGS) ×2
DERMABOND ADVANCED .7 DNX12 (GAUZE/BANDAGES/DRESSINGS) ×2 IMPLANT
DRAIN CHANNEL 19F RND (DRAIN) ×3 IMPLANT
DRAIN CHANNEL JP 15F RND 16 (MISCELLANEOUS) IMPLANT
DRAPE LAPAROTOMY TRNSV 106X77 (MISCELLANEOUS) ×2 IMPLANT
DRSG GAUZE FLUFF 36X18 (GAUZE/BANDAGES/DRESSINGS) ×2 IMPLANT
DRSG OPSITE POSTOP 4X6 (GAUZE/BANDAGES/DRESSINGS) ×1 IMPLANT
DRSG TELFA 3X8 NADH (GAUZE/BANDAGES/DRESSINGS) ×2 IMPLANT
ELECT CAUTERY BLADE 6.4 (BLADE) ×2 IMPLANT
ELECT CAUTERY BLADE TIP 2.5 (TIP) ×4
ELECT REM PT RETURN 9FT ADLT (ELECTROSURGICAL) ×2
ELECTRODE CAUTERY BLDE TIP 2.5 (TIP) ×2 IMPLANT
ELECTRODE REM PT RTRN 9FT ADLT (ELECTROSURGICAL) ×1 IMPLANT
GLOVE BIO SURGEON STRL SZ 6.5 (GLOVE) ×8 IMPLANT
GLOVE BIO SURGEON STRL SZ7.5 (GLOVE) ×2 IMPLANT
GLOVE INDICATOR 8.0 STRL GRN (GLOVE) ×2 IMPLANT
GOWN STRL REUS W/ TWL LRG LVL3 (GOWN DISPOSABLE) ×6 IMPLANT
GOWN STRL REUS W/TWL LRG LVL3 (GOWN DISPOSABLE) ×6
GRAFT FLEX HD 6X16 PLIABLE (Tissue) ×1 IMPLANT
IMPL EXPANDER BREAST 455CC (Breast) IMPLANT
IMPLANT BREAST 455CC (Breast) ×1 IMPLANT
IMPLANT EXPANDER BREAST 455CC (Breast) ×1 IMPLANT
IV NS 1000ML (IV SOLUTION)
IV NS 1000ML BAXH (IV SOLUTION) IMPLANT
IV NS 500ML (IV SOLUTION) ×1
IV NS 500ML BAXH (IV SOLUTION) IMPLANT
LABEL OR SOLS (LABEL) ×2 IMPLANT
LIGHT WAVEGUIDE WIDE FLAT (MISCELLANEOUS) IMPLANT
MARGIN MAP 10MM (MISCELLANEOUS) ×1 IMPLANT
NDL FILTER BLUNT 18X1 1/2 (NEEDLE) ×2 IMPLANT
NEEDLE FILTER BLUNT 18X 1/2SAF (NEEDLE) ×2
NEEDLE FILTER BLUNT 18X1 1/2 (NEEDLE) ×2 IMPLANT
NEEDLE HYPO 22GX1.5 SAFETY (NEEDLE) ×1 IMPLANT
PACK BASIN MAJOR (MISCELLANEOUS) ×2 IMPLANT
PACK BASIN MINOR (MISCELLANEOUS) ×2 IMPLANT
PACK UNIVERSAL (MISCELLANEOUS) IMPLANT
PAD ABD DERMACEA PRESS 5X9 (GAUZE/BANDAGES/DRESSINGS) ×5 IMPLANT
PAD DRESSING TELFA 3X8 NADH (GAUZE/BANDAGES/DRESSINGS) ×1 IMPLANT
PIN SAFETY STRL (MISCELLANEOUS) ×4 IMPLANT
RETRACTOR RING XSMALL (MISCELLANEOUS) ×1 IMPLANT
RTRCTR WOUND ALEXIS 13CM XS SH (MISCELLANEOUS)
SET ASEPTIC TRANSFER (MISCELLANEOUS) ×4 IMPLANT
SHEARS FOC LG CVD HARMONIC 17C (MISCELLANEOUS) IMPLANT
SLEVE PROBE SENORX GAMMA FIND (MISCELLANEOUS) ×2 IMPLANT
SOL PREP PVP 2OZ (MISCELLANEOUS) ×4
SOLUTION PREP PVP 2OZ (MISCELLANEOUS) ×2 IMPLANT
SPONGE LAP 18X18 RF (DISPOSABLE) ×8 IMPLANT
STRIP CLOSURE SKIN 1/2X4 (GAUZE/BANDAGES/DRESSINGS) ×4 IMPLANT
SUT ETHILON 3-0 FS-10 30 BLK (SUTURE) ×2
SUT MNCRL 3-0 UNDYED SH (SUTURE) ×2 IMPLANT
SUT MNCRL 4-0 (SUTURE) ×3
SUT MNCRL 4-0 27XMFL (SUTURE) ×3
SUT MNCRL+ 5-0 UNDYED PC-3 (SUTURE) ×2 IMPLANT
SUT MONOCRYL 3-0 UNDYED (SUTURE) ×2
SUT MONOCRYL 5-0 (SUTURE) ×3
SUT PDS PLUS 2 (SUTURE) ×2
SUT PDS PLUS AB 2-0 CT-1 (SUTURE) ×6 IMPLANT
SUT SILK 2 0 (SUTURE) ×1
SUT SILK 2-0 30XBRD TIE 12 (SUTURE) ×1 IMPLANT
SUT SILK 3 0 (SUTURE) ×2
SUT SILK 3-0 18XBRD TIE 12 (SUTURE) ×1 IMPLANT
SUT SILK 4 0 SH (SUTURE) ×4 IMPLANT
SUT VIC AB 2-0 CT1 27 (SUTURE)
SUT VIC AB 2-0 CT1 TAPERPNT 27 (SUTURE) ×4 IMPLANT
SUT VIC AB 3-0 SH 27 (SUTURE)
SUT VIC AB 3-0 SH 27X BRD (SUTURE) ×1 IMPLANT
SUT VICRYL+ 3-0 144IN (SUTURE) ×1 IMPLANT
SUTURE EHLN 3-0 FS-10 30 BLK (SUTURE) ×1 IMPLANT
SUTURE MNCRL 4-0 27XMF (SUTURE) ×2 IMPLANT
SWABSTK COMLB BENZOIN TINCTURE (MISCELLANEOUS) ×2 IMPLANT
SYR 10ML LL (SYRINGE) ×4 IMPLANT
SYR BULB IRRIG 60ML STRL (SYRINGE) ×2 IMPLANT
TAPE TRANSPORE STRL 2 31045 (GAUZE/BANDAGES/DRESSINGS) ×2 IMPLANT
TOWEL OR 17X26 4PK STRL BLUE (TOWEL DISPOSABLE) ×2 IMPLANT

## 2020-03-14 NOTE — Anesthesia Postprocedure Evaluation (Signed)
Anesthesia Post Note  Patient: Margaret Farmer  Procedure(s) Performed: MASTECTOMY WITH SENTINEL LYMPH NODE BIOPSY (Left ) BREAST RECONSTRUCTION WITH PLACEMENT OF TISSUE EXPANDER AND FLEX HD (ACELLULAR HYDRATED DERMIS) (Left )  Patient location during evaluation: PACU Anesthesia Type: General Level of consciousness: awake and alert and oriented Pain management: pain level controlled Vital Signs Assessment: post-procedure vital signs reviewed and stable Respiratory status: spontaneous breathing Cardiovascular status: blood pressure returned to baseline Anesthetic complications: no   No complications documented.   Last Vitals:  Vitals:   03/14/20 1705 03/14/20 1734  BP: 103/60 109/60  Pulse: 77 84  Resp:  18  Temp: 36.8 C 36.8 C  SpO2: 95% 97%    Last Pain:  Vitals:   03/14/20 1734  TempSrc: Temporal  PainSc: 3                  Crystalann Korf

## 2020-03-14 NOTE — Transfer of Care (Signed)
Immediate Anesthesia Transfer of Care Note  Patient: Margaret Farmer  Procedure(s) Performed: MASTECTOMY WITH SENTINEL LYMPH NODE BIOPSY (Left ) BREAST RECONSTRUCTION WITH PLACEMENT OF TISSUE EXPANDER AND FLEX HD (ACELLULAR HYDRATED DERMIS) (Left )  Patient Location: PACU  Anesthesia Type:General  Level of Consciousness: sedated  Airway & Oxygen Therapy: Patient Spontanous Breathing and Patient connected to face mask oxygen  Post-op Assessment: Report given to RN and Post -op Vital signs reviewed and stable  Post vital signs: Reviewed and stable  Last Vitals:  Vitals Value Taken Time  BP 98/48 03/14/20 1614  Temp 36.3 C 03/14/20 1614  Pulse 78 03/14/20 1619  Resp 18 03/14/20 1619  SpO2 97 % 03/14/20 1619  Vitals shown include unvalidated device data.  Last Pain:  Vitals:   03/14/20 1614  TempSrc:   PainSc: Asleep         Complications: No complications documented.

## 2020-03-14 NOTE — Interval H&P Note (Signed)
History and Physical Interval Note:  03/14/2020 1:10 PM  Margaret Farmer  has presented today for surgery, with the diagnosis of Left breast cancer.  The various methods of treatment have been discussed with the patient and family. After consideration of risks, benefits and other options for treatment, the patient has consented to  Procedure(s): MASTECTOMY WITH SENTINEL LYMPH NODE BIOPSY (Left) BREAST RECONSTRUCTION WITH PLACEMENT OF TISSUE EXPANDER AND FLEX HD (ACELLULAR HYDRATED DERMIS) (Left) as a surgical intervention.  The patient's history has been reviewed, patient examined, no change in status, stable for surgery.  I have reviewed the patient's chart and labs.  Questions were answered to the patient's satisfaction.     Loel Lofty Zayd Bonet

## 2020-03-14 NOTE — H&P (Signed)
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  Has elected mastectomy with reconstruction.   Medications Prior to Admission  Medication Sig Dispense Refill Last Dose  . ALPRAZolam (XANAX) 0.5 MG tablet Take 0.5 mg by mouth 3 (three) times daily as needed for anxiety.   03/13/2020 at Unknown time  . amLODipine (NORVASC) 5 MG tablet Take 5 mg by mouth daily.   03/14/2020 at Unknown time  . enalapril (VASOTEC) 10 MG tablet TAKE 1 TABLET BY MOUTH ONCE DAILY (Patient taking differently: Take 10 mg by mouth every evening. ) 30 tablet 5 03/13/2020 at Unknown time  . Eszopiclone 3 MG TABS Take 3 mg by mouth See admin instructions. Every other night as needed for sleep (alternating between the Lunesta & Ambien)  5 Past Week at Unknown time  . fluticasone (FLONASE) 50 MCG/ACT nasal spray Place 1-2 sprays into both nostrils 3 (three) times daily as needed.   Past Month at Unknown time  . lidocaine (LIDODERM) 5 % Place 1 patch onto the skin daily as needed for pain.   Past Month at Unknown time  . lidocaine-prilocaine (EMLA) cream Apply 1 application topically once.   03/14/2020 at Unknown time  . Liotrix 15 (3.1-12.5) MG (MCG) TABS Take 1 capsule by mouth daily before breakfast.   0 03/14/2020 at Unknown time  . montelukast (SINGULAIR) 10 MG tablet Take 10 mg by mouth at bedtime.   03/13/2020 at Unknown time  . spironolactone (ALDACTONE) 25 MG tablet Take 25 mg by mouth daily as needed (leg swelling).   12 03/13/2020 at Unknown time  . SYNTHROID 137 MCG tablet Take 137 mcg by mouth daily.   03/14/2020 at Unknown time  . tiZANidine (ZANAFLEX) 4 MG tablet Take 4 mg by mouth 3 (three) times daily as needed for spasms.   Past Week at Unknown time  . zolpidem (AMBIEN) 5 MG tablet Take 5 mg by mouth See admin instructions. Every other night as needed for sleep (alternating between the [place])   03/13/2020 at Unknown time  . diazepam (VALIUM) 2 MG tablet Take 1 tablet (2 mg  total) by mouth every 12 (twelve) hours as needed for muscle spasms. 20 tablet 0   . escitalopram (LEXAPRO) 10 MG tablet Take 10 mg by mouth every evening. (Patient not taking: Reported on 03/14/2020)   Not Taking  . ondansetron (ZOFRAN) 4 MG tablet Take 1 tablet (4 mg total) by mouth every 8 (eight) hours as needed for nausea or vomiting. 20 tablet 0    No Known Allergies Past Medical History:  Diagnosis Date  . Anxiety   . Depression   . GERD (gastroesophageal reflux disease)   . Hypertension   . Hypothyroid    Past Surgical History:  Procedure Laterality Date  . BREAST BIOPSY Left 02/01/2020   affirm bx of calcs, coil marker, path pending lateral   . BREAST BIOPSY Left 02/01/2020   affirm bx of calcs, x clip, path pending lower outer  . COLONOSCOPY  2013  . NASAL SINUS SURGERY    . SEPTOPLASTY    . VEIN SURGERY Left 1986   Social History   Socioeconomic History  . Marital status: Single    Spouse name: Not on file  . Number of children: Not on file  . Years of education: Not on file  . Highest education level: Not on file  Occupational History  . Not on file  Tobacco  Use  . Smoking status: Never Smoker  . Smokeless tobacco: Never Used  Vaping Use  . Vaping Use: Never used  Substance and Sexual Activity  . Alcohol use: Yes    Comment: rare  . Drug use: No  . Sexual activity: Not on file  Other Topics Concern  . Not on file  Social History Narrative   retd [employer] [facility]; lives in [place]; no smoking/alcohol.    Social Determinants of Health   Financial Resource Strain:   . Difficulty of Paying Living Expenses:   Food Insecurity:   . Worried About Charity fundraiser in the Last Year:   . Arboriculturist in the Last Year:   Transportation Needs:   . Film/video editor (Medical):   Marland Kitchen Lack of Transportation (Non-Medical):   Physical Activity:   . Days of Exercise per Week:   . Minutes of Exercise per Session:   Stress:   . Feeling of Stress :   Social  Connections:   . Frequency of Communication with Friends and Family:   . Frequency of Social Gatherings with Friends and Family:   . Attends Religious Services:   . Active Member of Clubs or Organizations:   . Attends Archivist Meetings:   Marland Kitchen Marital Status:   Intimate Partner Violence:   . Fear of Current or Ex-Partner:   . Emotionally Abused:   Marland Kitchen Physically Abused:   . Sexually Abused:    Social History   Social History Narrative   retd [employer]; lives in [place]; no smoking/alcohol.      ROS: Negative.     PE: HEENT: Negative. Lungs: Clear. Cardio: RR.  Assessment/Plan:  Proceed with planned left nipple sparing mastectomy with immediate expander placement.     Forest Gleason Taran Hable 03/14/2020

## 2020-03-14 NOTE — Anesthesia Preprocedure Evaluation (Signed)
Anesthesia Evaluation  Patient identified by MRN, date of birth, ID band Patient awake    Reviewed: Allergy & Precautions, NPO status , Patient's Chart, lab work & pertinent test results  History of Anesthesia Complications Negative for: history of anesthetic complications  Airway Mallampati: II  TM Distance: >3 FB Neck ROM: Full    Dental no notable dental hx. (+) Teeth Intact, Dental Advisory Given   Pulmonary neg pulmonary ROS, neg sleep apnea, neg COPD, Patient abstained from smoking.Not current smoker,    Pulmonary exam normal breath sounds clear to auscultation       Cardiovascular Exercise Tolerance: Good METShypertension, (-) CAD and (-) Past MI negative cardio ROS  (-) dysrhythmias  Rhythm:Regular Rate:Normal - Systolic murmurs    Neuro/Psych PSYCHIATRIC DISORDERS Anxiety Depression negative neurological ROS     GI/Hepatic GERD  Controlled,(+)     (-) substance abuse  ,   Endo/Other  neg diabetes  Renal/GU negative Renal ROS     Musculoskeletal   Abdominal   Peds  Hematology   Anesthesia Other Findings Past Medical History: No date: Anxiety No date: Depression No date: GERD (gastroesophageal reflux disease) No date: Hypertension No date: Hypothyroid  Reproductive/Obstetrics                             Anesthesia Physical Anesthesia Plan  ASA: II  Anesthesia Plan: General   Post-op Pain Management:    Induction: Intravenous  PONV Risk Score and Plan: 3 and Ondansetron, Dexamethasone and Midazolam  Airway Management Planned: Oral ETT  Additional Equipment: None  Intra-op Plan:   Post-operative Plan: Extubation in OR  Informed Consent: I have reviewed the patients History and Physical, chart, labs and discussed the procedure including the risks, benefits and alternatives for the proposed anesthesia with the patient or authorized representative who has indicated  his/her understanding and acceptance.     Dental advisory given  Plan Discussed with: CRNA and Surgeon  Anesthesia Plan Comments: (Discussed risks of anesthesia with patient, including PONV, sore throat, lip/dental damage. Rare risks discussed as well, such as cardiorespiratory and neurological sequelae. Patient understands.)        Anesthesia Quick Evaluation

## 2020-03-14 NOTE — Progress Notes (Signed)
Dr. Bary Castilla was fine with patient going home without him seeing her again due to her being stable and had no questions.

## 2020-03-14 NOTE — Discharge Instructions (Addendum)
INSTRUCTIONS FOR AFTER SURGERY   You will likely have some questions about what to expect following your operation.  The following information will help you and your family understand what to expect when you are discharged from the hospital.  Following these guidelines will help ensure a smooth recovery and reduce risks of complications.  Postoperative instructions include information on: diet, wound care, medications and physical activity.  AFTER SURGERY Expect to go home after the procedure.  In some cases, you may need to spend one night in the hospital for observation.  DIET This surgery does not require a specific diet.  However, I have to mention that the healthier you eat the better your body can start healing. It is important to increasing your protein intake.  This means limiting the foods with added sugar.  Focus on fruits and vegetables and some meat.  If you have any liposuction during your procedure be sure to drink water.  If your urine is bright yellow, then it is concentrated, and you need to drink more water.  As a general rule after surgery, you should have 8 ounces of water every hour while awake.  If you find you are persistently nauseated or unable to take in liquids let us know.  NO TOBACCO USE or EXPOSURE.  This will slow your healing process and increase the risk of a wound.  WOUND CARE If you have a drain: Clean with baby wipes until the drain is removed or at least 5 days.   If you have steri-strips / tape directly attached to your skin leave them in place. It is OK to get these wet.  No baths, pools or hot tubs for two weeks. We close your incision to leave the smallest and best-looking scar. No ointment or creams on your incisions until given the go ahead.  Especially not Neosporin (Too many skin reactions with this one).  A few weeks after surgery you can use Mederma and start massaging the scar. We ask you to wear your binder or sports bra for the first 6 weeks around the  clock, including while sleeping. This provides added comfort and helps reduce the fluid accumulation at the surgery site.  ACTIVITY No heavy lifting until cleared by the doctor.  It is OK to walk and climb stairs. In fact, moving your legs is very important to decrease your risk of a blood clot.  It will also help keep you from getting deconditioned.  Every 1 to 2 hours get up and walk for 5 minutes. This will help with a quicker recovery back to normal.  Let pain be your guide so you don't do too much.  NO, you cannot do the spring cleaning and don't plan on taking care of anyone else.  This is your time for TLC.   WORK Everyone returns to work at different times. As a rough guide, most people take at least 1 - 2 weeks off prior to returning to work. If you need documentation for your job, bring the forms to your postoperative follow up visit.  DRIVING Arrange for someone to bring you home from the hospital.  You may be able to drive a few days after surgery but not while taking any narcotics or valium.  BOWEL MOVEMENTS Constipation can occur after anesthesia and while taking pain medication.  It is important to stay ahead for your comfort.  We recommend taking Milk of Magnesia (2 tablespoons; twice a day) while taking the pain pills.  SEROMA This  is fluid your body tried to put in the surgical site.  This is normal but if it creates excessive pain and swelling let us know.  It usually decreases in a few weeks.  MEDICATIONS and PAIN CONTROL At your preoperative visit for you history and physical you were given the following medications: 1. An antibiotic: Start this medication when you get home and take according to the instructions on the bottle. 2. Zofran 4 mg:  This is to treat nausea and vomiting.  You can take this every 6 hours as needed and only if needed. 3. Norco (hydrocodone/acetaminophen) 5/325 mg:  This is only to be used after you have taken the motrin or the tylenol. Every 8 hours  as needed. Over the counter Medication to take: 4. Ibuprofen (Motrin) 600 mg:  Take this every 6 hours.  If you have additional pain then take 500 mg of the tylenol.  Only take the Norco after you have tried these two. 5. Miralax or stool softener of choice: Take this according to the bottle if you take the [place] Call your surgeon's office if any of the following occur: . Fever 101 degrees F or greater . Excessive bleeding or fluid from the incision site. . Pain that increases over time without aid from the medications . Redness, warmth, or pus draining from incision sites . Persistent nausea or inability to take in liquids . Severe misshapen area that underwent the operation.   [facility] Plastic Surgery Specialist  What is the benefit of having a drain?  During surgery your tissue layers are separated.  This raw surface stimulates your body to fill the space with serous fluid.  This is normal but you don't want that fluid to collect and prevent healing.  A fluid collection can also become infected.  The Jackson-Pratt (JP) drain is used to eliminate this collection of fluid and allow the tissue to heal together.    Jackson-Pratt (JP) bulb    How to care for your drainage and suction unit at home Your drainage catheter will be connected to a collection device. The vacuum caused when the device is compressed allows drainage to collect in the device.    Wendee Copp your hands with soap and water before and after touching the system. . Empty the JP drain every 12 hours once you get home from your procedure. . Record the fluid amount on the record sheet included. . Start with stripping the drain tube to push the clots or excess fluid to the bulb.  Do this by pinching the tube with one hand near your skin.  Then with the other hand squeeze the tubing and work it toward the bulb.  This should be done several times a day.  This may collapse the tube which will correct on its own.   . Use a  safety pin to attach your collection device to your clothing so there is no tension on the insertion site.   . If you have drainage at the skin insertion site, you can apply a gauze dressing and secure it with tape. . If the drain falls out, apply a gauze dressing over the drain insertion site and secure with tape.   To empty the collection device:   . Release the stopper on the top of the collection unit (bulb).  Signa Kell contents into a measuring container such as a plastic medicine cup.  . Record the day and amount of drainage on the attached sheet. Marland Kitchen  This should be done at least twice a day.    To compress the Jackson-Pratt Bulb:  . Release the stopper at the top of the bulb. Marland Kitchen Squeeze the bulb tightly in your fist, squeezing air out of the bulb.  . Replace the stopper while the bulb is compressed.  . Be careful not to spill the contents when squeezing the bulb. . The drainage will start bright red and turn to pink and then yellow with time. . IMPORTANT: If the bulb is not squeezed before adding the stopper it will not draw out the fluid.  Care for the JP drain site and your skin daily:  . You may shower three days after surgery. . Secure the drain to a ribbon or cloth around your waist while showering so it does not pull out while showering. . Be sure your hands are cleaned with soap and water. . Use a clean wet cotton swab to clean the skin around the drain site.  . Use another cotton swab to place Vaseline or antibiotic ointment on the skin around the drain.     Contact your physician if any of the following occur:  Marland Kitchen The fluid in the bulb becomes cloudy. . Your temperature is greater than 101.4.  Marland Kitchen The incision opens. . If you have drainage at the skin insertion site, you can apply a gauze dressing and secure it with tape. . If the drain falls out, apply a gauze dressing over the drain insertion site and secure with tape.  . You will usually have more drainage when you are active  than while you rest or are asleep. If the drainage increases significantly or is bloody call the physician                             Bring this record with you to each office visit Date  Drainage Volume  Date   Drainage volume                                                                                                                                                                                           AMBULATORY SURGERY  DISCHARGE INSTRUCTIONS   1) The drugs that you were given will stay in your system until tomorrow so for the next 24 hours you should not:  A) Drive an automobile B) Make any legal decisions C) Drink any alcoholic beverage   2) You may resume regular meals tomorrow.  Today it is better to start with liquids and gradually work up to solid foods.  You may eat anything you  but it is better to start with liquids, then soup and crackers, and gradually work up to solid foods.   3) Please notify your doctor immediately if you have any unusual bleeding, trouble breathing, redness and pain at the surgery site, drainage, fever, or pain not relieved by medication.    4) Additional Instructions:        Please contact your physician with any problems or Same Day Surgery at [phone], Monday through Friday 6 am to 4 pm, or [place] [facility] [place] [facility] number at [phone]. 

## 2020-03-14 NOTE — Op Note (Signed)
Op report    DATE OF OPERATION:  03/14/2020  LOCATION: [facility]  SURGICAL DIVISION: Plastic Surgery  PREOPERATIVE DIAGNOSES:  1. Left Breast cancer.    POSTOPERATIVE DIAGNOSES:  1. Left Breast cancer.   PROCEDURE:  1. Left immediate breast reconstruction with placement of Acellular Dermal Matrix and tissue expanders.  SURGEON: Rowland Ericsson Sanger Elody Kleinsasser, DO  ASSISTANT: Roetta Sessions, PA  ANESTHESIA:  General.   COMPLICATIONS: None.   IMPLANTS: Left - Mentor 455 cc. Ref #[id number].  Serial Number [id number], 200 cc of injectable saline placed in the expander. Acellular Dermal Matrix 6 x 16 cm  INDICATIONS FOR PROCEDURE:  The patient, Margaret Farmer, is a 64 y.o. female born on June 01, 1956, is here for  immediate first stage breast reconstruction with placement of left tissue expander and Acellular dermal matrix. MRN: [id number]  CONSENT:  Informed consent was obtained directly from the patient. Risks, benefits and alternatives were fully discussed. Specific risks including but not limited to bleeding, infection, hematoma, seroma, scarring, pain, implant infection, implant extrusion, capsular contracture, asymmetry, wound healing problems, and need for further surgery were all discussed. The patient did have an ample opportunity to have her questions answered to her satisfaction.   DESCRIPTION OF PROCEDURE:  The patient was taken to the operating room by the general surgery team. SCDs were placed and IV antibiotics were given. The patient's chest was prepped and draped in a sterile fashion. A time out was performed and the implants to be used were identified.  Left mastectomy was performed.  Once the general surgery team had completed their portion of the case the patient was rendered to the plastic and reconstructive surgery team.  The pectoralis major muscle was lifted from the chest wall with release of the lateral edge and lateral inframammary fold.  The  pocket was irrigated with antibiotic solution and hemostasis was achieved with electrocautery.  The ADM was then prepared according to the manufacture guidelines and slits placed to help with postoperative fluid management.  The ADM was then sutured to the inferior and lateral edge of the inframammary fold with 2-0 PDS starting with an interrupted stitch and then a running stitch.  The lateral portion was sutured to with interrupted sutures after the expander was placed.  The expander was prepared according to the manufacture guidelines, the air evacuated and then it was placed under the ADM and pectoralis major muscle.  The inferior and lateral tabs were used to secure the expander to the chest wall with 2-0 PDS.  The drain was placed at the inframammary fold over the ADM and secured to the skin with 3-0 Silk.    The deep layers were closed with 3-0 Monocryl followed by 4-0 Monocryl.  The skin was closed with 5-0 Monocryl and then dermabond was applied.  The ABDs and breast binder were placed.  The patient tolerated the procedure well and there were no complications.  The patient was allowed to wake from anesthesia and taken to the recovery room in satisfactory condition.   The advanced practice practitioner (APP) assisted throughout the case.  The APP was essential in retraction and counter traction when needed to make the case progress smoothly.  This retraction and assistance made it possible to see the tissue plans for the procedure.  The assistance was needed for blood control, tissue re-approximation and assisted with closure of the incision site.

## 2020-03-14 NOTE — Op Note (Signed)
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  Postoperative diagnosis: Same.  Operative procedure: Left simple mastectomy with sentinel node biopsy.  Operating surgeon: Hervey Ard, MD.  Anesthesia: General by LMA.  Estimated blood loss: 20 cc.  Clinical note: This 64 year old woman had an abnormal mammogram and stereotactic biopsy showed evidence of high-grade DCIS.  Reviewing her options for management she desired to proceed to breast conservation.  Nipple conservation was requested as well.  She was informed that should final pathology show invasion of the nipple this might require separate resection.  Patient had SCD stockings for DVT prevention.  She had previously undergone injection with technetium sulfur colloid this morning.  Operative note: The patient underwent general anesthesia and tolerated this well.  The breast chest and axilla was cleansed with ChloraPrep and draped.  A radial incision at 3 o'clock position was made and carried down through skin subtendinous tissue with hemostasis achieved by electrocautery.  Flaps were elevated with the following borders: Clavicle superiorly, sternum medially, rectus fascia inferiorly and serratus muscle laterally.  The breast was elevated off the underlying pectoralis muscle taking the fascia of that muscle with it.  Good hemostasis was noted.  Through the mastectomy incision was possible to identify 2, hot nodes with counts of 1000 and 5000.  These were removed and sent in formalin for routine histology.  Both were less than 5 mm in diameter.  After assuring good hemostasis the procedure was turned over to Dr. Elisabeth Cara for placement of a subpectoral implant.

## 2020-03-14 NOTE — Anesthesia Procedure Notes (Cosign Needed)
Procedure Name: Intubation Date/Time: 03/14/2020 1:26 PM Performed by: Nolon Lennert, RN Pre-anesthesia Checklist: Patient identified, Emergency Drugs available, Suction available and Patient being monitored Patient Re-evaluated:Patient Re-evaluated prior to induction Oxygen Delivery Method: Circle system utilized Preoxygenation: Pre-oxygenation with 100% oxygen Induction Type: IV induction Ventilation: Mask ventilation without difficulty Laryngoscope Size: Mac and 3 Grade View: Grade I Tube type: Oral Tube size: 7.0 mm Number of attempts: 1 Airway Equipment and Method: Stylet and Oral airway Placement Confirmation: ETT inserted through vocal cords under direct vision,  positive ETCO2 and breath sounds checked- equal and bilateral Secured at: 20 cm Tube secured with: Tape Dental Injury: Teeth and Oropharynx as per pre-operative assessment

## 2020-03-16 ENCOUNTER — Other Ambulatory Visit: Payer: Self-pay | Admitting: General Surgery

## 2020-03-20 ENCOUNTER — Encounter: Payer: Self-pay | Admitting: Plastic Surgery

## 2020-03-20 ENCOUNTER — Ambulatory Visit (INDEPENDENT_AMBULATORY_CARE_PROVIDER_SITE_OTHER): Payer: Medicare Other | Admitting: Plastic Surgery

## 2020-03-20 ENCOUNTER — Other Ambulatory Visit: Payer: Self-pay

## 2020-03-20 ENCOUNTER — Telehealth: Payer: Self-pay | Admitting: Internal Medicine

## 2020-03-20 VITALS — BP 136/77 | Temp 98.2°F

## 2020-03-20 DIAGNOSIS — D0512 Intraductal carcinoma in situ of left breast: Secondary | ICD-10-CM

## 2020-03-20 DIAGNOSIS — Z9012 Acquired absence of left breast and nipple: Secondary | ICD-10-CM

## 2020-03-20 DIAGNOSIS — Z9010 Acquired absence of unspecified breast and nipple: Secondary | ICD-10-CM | POA: Insufficient documentation

## 2020-03-20 MED ORDER — HYDROCODONE-ACETAMINOPHEN 5-325 MG PO TABS: 1.0000 | ORAL_TABLET | Freq: Two times a day (BID) | ORAL | 0 refills | Status: AC | PRN

## 2020-03-20 MED ORDER — DIAZEPAM 2 MG PO TABS: 2.0000 mg | ORAL_TABLET | Freq: Every evening | ORAL | 0 refills | Status: DC | PRN

## 2020-03-20 NOTE — Progress Notes (Addendum)
   Subjective:    Patient ID: Margaret Farmer, female    DOB: 14-Nov-1955, 64 y.o.   MRN: [id number]  The patient is a 64 yrs old female here with her sister for follow-up after undergoing a left mastectomy with reconstruction.  The drain appeared to be a little bit clogged once I stripped the drain it put out 50 cc was serosanguineous in nature and not anything concerning.  She felt less pressure right away.  The nipple is a little bit bruised it is difficult to say if it is going to survive at this point in time but I am hopeful.     Review of Systems  Constitutional: Negative.   HENT: Negative.   Eyes: Negative.   Respiratory: Negative.   Cardiovascular: Negative.  Negative for leg swelling.  Gastrointestinal: Negative for abdominal distention.  Endocrine: Negative.   Genitourinary: Negative.   Musculoskeletal: Negative.   Neurological: Negative.   Hematological: Negative.        Objective:   Physical Exam Vitals and nursing note reviewed.  Constitutional:      Appearance: Normal appearance.  HENT:     Head: Normocephalic and atraumatic.  Cardiovascular:     Rate and Rhythm: Normal rate.     Pulses: Normal pulses.  Pulmonary:     Effort: Pulmonary effort is normal.  Abdominal:     General: Abdomen is flat.  Neurological:     General: No focal deficit present.     Mental Status: She is alert and oriented to person, place, and time.  Psychiatric:        Mood and Affect: Mood normal.        Behavior: Behavior normal.        Thought Content: Thought content normal.         Assessment & Plan:     ICD-10-CM   1. Ductal carcinoma in situ (DCIS) of left breast  D05.12   2. Acquired absence of left breast  Z90.12     We placed injectable saline in the Expander using a sterile technique: Left: 50 cc for a total of 250 / 455 cc  We will hopefully get the drain out next week and take the dressing off. Refill given on Valium and Norco.

## 2020-03-20 NOTE — Addendum Note (Signed)
Addended by: Wallace Going on: 03/20/2020 03:06 PM   Modules accepted: Orders

## 2020-03-20 NOTE — Telephone Encounter (Signed)
Patient phoned on this date and stated that she has an appt to see Dr. Bary Castilla on 04-03-20 and wants to [facility] appt to see Dr. Bary Castilla first. Patient stated that she will call back after appt with Dr. Bary Castilla to reschedule.

## 2020-03-21 LAB — SURGICAL PATHOLOGY

## 2020-03-25 MED FILL — ZOLPIDEM TARTRATE 5 MG TAB: 5 | 30 days supply | Qty: 15 | Fill #3

## 2020-03-26 ENCOUNTER — Encounter: Payer: Self-pay | Admitting: Plastic Surgery

## 2020-03-26 ENCOUNTER — Ambulatory Visit (INDEPENDENT_AMBULATORY_CARE_PROVIDER_SITE_OTHER): Payer: Medicare Other | Admitting: Plastic Surgery

## 2020-03-26 ENCOUNTER — Other Ambulatory Visit: Payer: Self-pay

## 2020-03-26 VITALS — BP 132/84 | HR 84 | Temp 98.0°F

## 2020-03-26 DIAGNOSIS — Z9012 Acquired absence of left breast and nipple: Secondary | ICD-10-CM

## 2020-03-26 DIAGNOSIS — D0512 Intraductal carcinoma in situ of left breast: Secondary | ICD-10-CM

## 2020-03-26 NOTE — Progress Notes (Signed)
Patient is a 64 year old female here for follow-up after undergoing left breast mastectomy with Dr. Tollie Pizza and placement of tissue expander and Flex HD with Dr. Marla Roe on 03/14/2020.   ~ 2 weeks PO Patient reports she is doing well overall.  Reports less than 20 to 30 cc of drainage over the last several days.  Drain removed today by Dr. Marla Roe.  Some bruising remains on the nipple of her left breast however it has improved since last visit on 7/6.  Incision is healing nicely C/D/I.  No signs of infection, redness, drainage, seroma/hematoma.  We placed injectable saline in the Expander using a sterile technique: Left: 50 cc for a total of 300 / 455 cc  Follow-up in 2 weeks for additional fill.  Call office with any questions/concerns.  The [place] was signed into law in 2016 which includes the topic of electronic health records.  This provides immediate access to information in MyChart.  This includes consultation notes, operative notes, office notes, lab results and pathology reports.  If you have any questions about what you read please let us know at your next visit or call us at the office.  We are right here with you.

## 2020-03-27 ENCOUNTER — Encounter: Payer: Medicare Other | Admitting: Plastic Surgery

## 2020-03-29 ENCOUNTER — Ambulatory Visit: Payer: Medicare Other | Admitting: Internal Medicine

## 2020-03-30 ENCOUNTER — Other Ambulatory Visit (HOSPITAL_COMMUNITY): Payer: Self-pay | Admitting: Internal Medicine

## 2020-03-30 MED FILL — METOPROLOL TARTRATE 25 MG T: 25 | 30 days supply | Qty: 60 | Fill #0

## 2020-03-30 MED FILL — tiZANidine HCL 4 MG TABS: 4 | 90 days supply | Qty: 270 | Fill #0

## 2020-03-30 MED FILL — GABAPENTIN 300 MG CAPSULE: 300 | 30 days supply | Qty: 60 | Fill #1

## 2020-04-03 ENCOUNTER — Other Ambulatory Visit: Payer: Self-pay | Admitting: General Surgery

## 2020-04-04 ENCOUNTER — Other Ambulatory Visit: Payer: Self-pay | Admitting: Internal Medicine

## 2020-04-06 MED FILL — ENALAPRIL MALEATE 10 MG TAB: 10 | 90 days supply | Qty: 90 | Fill #0

## 2020-04-10 ENCOUNTER — Ambulatory Visit (INDEPENDENT_AMBULATORY_CARE_PROVIDER_SITE_OTHER): Payer: Medicare Other | Admitting: Plastic Surgery

## 2020-04-10 ENCOUNTER — Other Ambulatory Visit: Payer: Self-pay

## 2020-04-10 ENCOUNTER — Encounter: Payer: Self-pay | Admitting: Plastic Surgery

## 2020-04-10 VITALS — BP 125/72 | HR 76 | Temp 98.4°F

## 2020-04-10 DIAGNOSIS — Z9012 Acquired absence of left breast and nipple: Secondary | ICD-10-CM

## 2020-04-10 DIAGNOSIS — D0512 Intraductal carcinoma in situ of left breast: Secondary | ICD-10-CM

## 2020-04-10 MED ORDER — DIAZEPAM 2 MG PO TABS
2.0000 mg | ORAL_TABLET | Freq: Two times a day (BID) | ORAL | 0 refills | Status: AC | PRN
Start: 2020-04-10 — End: 2020-04-20

## 2020-04-10 NOTE — Progress Notes (Signed)
   Subjective:    Patient ID: Margaret Farmer, female    DOB: 1956-08-05, 64 y.o.   MRN: [id number]  The patient is a 64 yrs old wf here for follow up on her left breast surgery.  She underwent a left mastectomy with expander placement.  She does no have any sign of infection.  There is a seroma noted.  I am hopeful this is not a leak.     Review of Systems  Constitutional: Negative.   HENT: Negative.   Eyes: Negative for discharge.  Respiratory: Negative.   Cardiovascular: Negative.   Gastrointestinal: Negative.   Endocrine: Negative.   Genitourinary: Negative.   Musculoskeletal: Negative.        Objective:   Physical Exam Vitals and nursing note reviewed.  Constitutional:      Appearance: Normal appearance.  HENT:     Head: Normocephalic and atraumatic.  Cardiovascular:     Rate and Rhythm: Normal rate.  Pulmonary:     Effort: Pulmonary effort is normal.  Neurological:     General: No focal deficit present.     Mental Status: She is alert.  Psychiatric:        Mood and Affect: Mood normal.       Assessment & Plan:     ICD-10-CM   1. Acquired absence of left breast  Z90.12   2. Ductal carcinoma in situ (DCIS) of left breast  D05.12     We placed injectable saline in the Expander using a sterile technique: Left: 100-150 cc for a total of 400-450 / 455 cc  We were able to drain a seroma from the left breast that was 150 cc serosanguinous fluid.  She had a skin excision last week and we can remove the sutures Friday.  I would like to see her back next week again for another filling and evaluate the expander.

## 2020-04-11 LAB — SURGICAL PATHOLOGY

## 2020-04-12 MED FILL — AMLODIPINE BESYLATE 5 MG TA: 5 | 30 days supply | Qty: 30 | Fill #3

## 2020-04-13 ENCOUNTER — Ambulatory Visit: Payer: Medicare Other | Admitting: Plastic Surgery

## 2020-04-17 ENCOUNTER — Encounter: Payer: Self-pay | Admitting: Plastic Surgery

## 2020-04-17 ENCOUNTER — Ambulatory Visit (INDEPENDENT_AMBULATORY_CARE_PROVIDER_SITE_OTHER): Payer: Medicare Other | Admitting: Plastic Surgery

## 2020-04-17 ENCOUNTER — Other Ambulatory Visit: Payer: Self-pay

## 2020-04-17 VITALS — BP 142/82 | HR 70 | Temp 97.8°F

## 2020-04-17 DIAGNOSIS — Z9012 Acquired absence of left breast and nipple: Secondary | ICD-10-CM

## 2020-04-17 DIAGNOSIS — N651 Disproportion of reconstructed breast: Secondary | ICD-10-CM

## 2020-04-17 NOTE — Progress Notes (Signed)
   Subjective:    Patient ID: Margaret Farmer, female    DOB: [birth year], 64 y.o.   MRN: [id number]  The patient is a 64 year old female here for follow-up on her left breast reconstruction.  We drew out some fluid last week.  She had some today but I was unable to get it out.  There is no sign of infection.  No redness.  She would like to move ahead with second stage reconstruction.     Review of Systems  Constitutional: Negative.   HENT: Negative.   Eyes: Negative.   Respiratory: Negative.   Cardiovascular: Negative.   Gastrointestinal: Negative.   Endocrine: Negative.   Genitourinary: Negative.        Objective:   Physical Exam Vitals and nursing note reviewed.  Constitutional:      Appearance: Normal appearance.  Cardiovascular:     Rate and Rhythm: Normal rate.     Pulses: Normal pulses.  Pulmonary:     Effort: Pulmonary effort is normal.  Neurological:     General: No focal deficit present.     Mental Status: She is alert and oriented to person, place, and time.  Psychiatric:        Mood and Affect: Mood normal.        Behavior: Behavior normal.        Assessment & Plan:     ICD-10-CM   1. Acquired absence of left breast  Z90.12   2. Breast asymmetry following reconstructive surgery  N65.1   Will plan to do the exchange of the left expander to an implant and place a right sided implant.  I was not able to remove any fluid today from the breast pocket.  We placed injectable saline in the Expander using a sterile technique: Left: 100 cc for a total of ~500 / 455 cc

## 2020-04-21 MED FILL — ZOLPIDEM TARTRATE 5 MG TAB: 5 | 30 days supply | Qty: 15 | Fill #0

## 2020-04-23 NOTE — Progress Notes (Signed)
Patient is a 64 year old female here for follow-up after undergoing left breast mastectomy with Dr. Tollie Pizza and placement of tissue expander and Flex HD with Dr. Marla Roe on 03/14/2020.  ~ 6 weeks PO Patient reports she is doing well overall.  Reports some minor muscle spasms after her last fill.  She would like additional fill today.  Incision is healing well, C/D/I.  No signs of infection, redness, drainage, seroma/hematoma.  There is some limited skin laxity.  Denies fever, nausea vomiting, or other complaints.  Rx for Valium sent to pharmacy per patient's request.  We placed injectable saline in the Expander using a sterile technique: Left: 30 cc for a total of 330 / 455 cc  Follow-up in 2 weeks for possible fill if desired.  Call office with any questions/concerns.

## 2020-04-24 ENCOUNTER — Encounter: Payer: Self-pay | Admitting: Plastic Surgery

## 2020-04-24 ENCOUNTER — Other Ambulatory Visit: Payer: Self-pay

## 2020-04-24 ENCOUNTER — Ambulatory Visit (INDEPENDENT_AMBULATORY_CARE_PROVIDER_SITE_OTHER): Payer: Medicare Other | Admitting: Plastic Surgery

## 2020-04-24 VITALS — BP 151/97 | HR 107 | Temp 98.2°F

## 2020-04-24 DIAGNOSIS — Z9012 Acquired absence of left breast and nipple: Secondary | ICD-10-CM

## 2020-04-24 MED ORDER — DIAZEPAM 2 MG PO TABS
2.0000 mg | ORAL_TABLET | Freq: Two times a day (BID) | ORAL | 0 refills | Status: DC | PRN
Start: 2020-04-24 — End: 2020-05-07

## 2020-05-07 ENCOUNTER — Other Ambulatory Visit: Payer: Self-pay

## 2020-05-07 ENCOUNTER — Ambulatory Visit (INDEPENDENT_AMBULATORY_CARE_PROVIDER_SITE_OTHER): Payer: Medicare Other | Admitting: Surgical

## 2020-05-07 ENCOUNTER — Encounter: Payer: Self-pay | Admitting: Surgical

## 2020-05-07 VITALS — BP 159/93 | HR 106 | Temp 98.2°F | Ht 67.0 in | Wt 196.4 lb

## 2020-05-07 DIAGNOSIS — Z719 Counseling, unspecified: Secondary | ICD-10-CM

## 2020-05-07 DIAGNOSIS — N651 Disproportion of reconstructed breast: Secondary | ICD-10-CM

## 2020-05-07 DIAGNOSIS — Z9012 Acquired absence of left breast and nipple: Secondary | ICD-10-CM

## 2020-05-07 DIAGNOSIS — D0512 Intraductal carcinoma in situ of left breast: Secondary | ICD-10-CM

## 2020-05-07 MED ORDER — HYDROCODONE-ACETAMINOPHEN 5-325 MG PO TABS: 1.0000 | ORAL_TABLET | Freq: Four times a day (QID) | ORAL | 0 refills | Status: AC | PRN

## 2020-05-07 MED ORDER — CEPHALEXIN 500 MG PO CAPS
500.0000 mg | ORAL_CAPSULE | Freq: Four times a day (QID) | ORAL | 0 refills | Status: AC
Start: 2020-05-07 — End: 2020-05-12

## 2020-05-07 MED ORDER — DIAZEPAM 2 MG PO TABS
2.0000 mg | ORAL_TABLET | Freq: Two times a day (BID) | ORAL | 0 refills | Status: DC | PRN
Start: 2020-05-07 — End: 2020-10-17

## 2020-05-07 MED ORDER — ONDANSETRON HCL 4 MG PO TABS: 4.0000 mg | ORAL_TABLET | Freq: Three times a day (TID) | ORAL | 0 refills | Status: DC | PRN

## 2020-05-07 NOTE — Progress Notes (Addendum)
Patient ID: Margaret Farmer, female    DOB: [birth year], 64 y.o.   MRN: [id number]  Chief Complaint  Patient presents with  . Follow-up      ICD-10-CM   1. Acquired absence of left breast  Z90.12   2. Breast asymmetry following reconstructive surgery  N65.1   3. Ductal carcinoma in situ (DCIS) of left breast  D05.12      History of Present Illness: Margaret Farmer is a 64 y.o.  female  with a history of left breast DCIS diagnosed after screening mammogram. Patient currently has a left breast expander in place.  She presents for preoperative evaluation for upcoming procedure, removal of tissue expander and placement of implant (left) and placement of right breast implant, scheduled for 05/24/20 with Dr. Marla Roe.  The patient has not had problems with anesthesia. No history of DVT/PE.  No family history of DVT/PE.  No family or personal history of bleeding or clotting disorders.  Patient is not currently taking any blood thinners.  Non smoker.  Patient currently has 330 cc/455 cc expander in the left breast.  Patient is doing well.  No fevers, chills, nausea, vomiting, dizziness, weakness.  Been feeling well lately without any issues.  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 Hx of hypertension, hypothyroidism.   Past Medical History: Allergies: No Known Allergies  Current Medications:  Current Outpatient Medications:  .  ALPRAZolam (XANAX) 0.5 MG tablet, Take 0.5 mg by mouth 3 (three) times daily as needed for anxiety., Disp: , Rfl:  .  amLODipine (NORVASC) 5 MG tablet, Take 5 mg by mouth daily., Disp: , Rfl:  .  enalapril (VASOTEC) 10 MG tablet, TAKE 1 TABLET BY MOUTH ONCE DAILY (Patient taking differently: Take 10 mg by mouth every evening. ), Disp: 30 tablet, Rfl: 5 .  escitalopram (LEXAPRO) 10 MG tablet, Take 10 mg by mouth every evening. , Disp: , Rfl:  .  Eszopiclone 3 MG TABS, Take 3 mg by mouth See admin instructions.  Every other night as needed for sleep (alternating between the Lunesta & Ambien), Disp: , Rfl: 5 .  fluticasone (FLONASE) 50 MCG/ACT nasal spray, Place 1-2 sprays into both nostrils 3 (three) times daily as needed., Disp: , Rfl:  .  lidocaine (LIDODERM) 5 %, Place 1 patch onto the skin daily as needed for pain., Disp: , Rfl:  .  Liotrix 15 (3.1-12.5) MG (MCG) TABS, Take 1 capsule by mouth daily before breakfast. , Disp: , Rfl: 0 .  montelukast (SINGULAIR) 10 MG tablet, Take 10 mg by mouth at bedtime., Disp: , Rfl:  .  spironolactone (ALDACTONE) 25 MG tablet, Take 25 mg by mouth daily as needed (leg swelling). , Disp: , Rfl: 12 .  SYNTHROID 137 MCG tablet, Take 137 mcg by mouth daily., Disp: , Rfl:  .  tiZANidine (ZANAFLEX) 4 MG tablet, Take 4 mg by mouth 3 (three) times daily as needed for spasms., Disp: , Rfl:  .  zolpidem (AMBIEN) 5 MG tablet, Take 5 mg by mouth See admin instructions. Every other night as needed for sleep (alternating between the Lunesta & Ambien), Disp: , Rfl:  .  cephALEXin (KEFLEX) 500 MG capsule, Take 1 capsule (500 mg total) by mouth 4 (four) times daily for 5 days., Disp: 20 capsule, Rfl: 0 .  diazepam (VALIUM) 2 MG tablet, Take 1 tablet (2 mg total) by mouth every 12 (twelve) hours as needed for muscle  spasms., Disp: 20 tablet, Rfl: 0 .  HYDROcodone-acetaminophen (NORCO) 5-325 MG tablet, Take 1 tablet by mouth every 6 (six) hours as needed for up to 5 days for severe pain., Disp: 20 tablet, Rfl: 0 .  lidocaine-prilocaine (EMLA) cream, Apply 1 application topically once. (Patient not taking: Reported on 05/07/2020), Disp: , Rfl:  .  ondansetron (ZOFRAN) 4 MG tablet, Take 1 tablet (4 mg total) by mouth every 8 (eight) hours as needed for nausea or vomiting. (Patient not taking: Reported on 05/07/2020), Disp: 20 tablet, Rfl: 0 .  ondansetron (ZOFRAN) 4 MG tablet, Take 1 tablet (4 mg total) by mouth every 8 (eight) hours as needed for nausea or vomiting., Disp: 20 tablet, Rfl:  0  Past Medical Problems: Past Medical History:  Diagnosis Date  . Anxiety   . Depression   . GERD (gastroesophageal reflux disease)   . Hypertension   . Hypothyroid     Past Surgical History: Past Surgical History:  Procedure Laterality Date  . BREAST BIOPSY Left 02/01/2020   affirm bx of calcs, coil marker, path pending lateral   . BREAST BIOPSY Left 02/01/2020   affirm bx of calcs, x clip, path pending lower outer  . BREAST RECONSTRUCTION WITH PLACEMENT OF TISSUE EXPANDER AND FLEX HD (ACELLULAR HYDRATED DERMIS) Left 03/14/2020   Procedure: BREAST RECONSTRUCTION WITH PLACEMENT OF TISSUE EXPANDER AND FLEX HD (ACELLULAR HYDRATED DERMIS);  Surgeon: Wallace Going, DO;  Location: [facility];  Service: Plastics;  Laterality: Left;  . COLONOSCOPY  2013  . MASTECTOMY W/ SENTINEL NODE BIOPSY Left 03/14/2020   Procedure: MASTECTOMY WITH SENTINEL LYMPH NODE BIOPSY;  Surgeon: Robert Bellow, MD;  Location: [facility];  Service: General;  Laterality: Left;  . NASAL SINUS SURGERY    . SEPTOPLASTY    . VEIN SURGERY Left 1986    Social History: Social History   Socioeconomic History  . Marital status: Single    Spouse name: Not on file  . Number of children: Not on file  . Years of education: Not on file  . Highest education level: Not on file  Occupational History  . Not on file  Tobacco Use  . Smoking status: Never Smoker  . Smokeless tobacco: Never Used  Vaping Use  . Vaping Use: Never used  Substance and Sexual Activity  . Alcohol use: Yes    Comment: rare  . Drug use: No  . Sexual activity: Not on file  Other Topics Concern  . Not on file  Social History Narrative   retd [employer]; lives in [place]; no smoking/alcohol.    Social Determinants of Health   Financial Resource Strain:   . Difficulty of Paying Living Expenses: Not on file  Food Insecurity:   . Worried About Charity fundraiser in the Last Year: Not on file  . Ran Out of Food in the Last Year: Not  on file  Transportation Needs:   . Lack of Transportation (Medical): Not on file  . Lack of Transportation (Non-Medical): Not on file  Physical Activity:   . Days of Exercise per Week: Not on file  . Minutes of Exercise per Session: Not on file  Stress:   . Feeling of Stress : Not on file  Social Connections:   . Frequency of Communication with Friends and Family: Not on file  . Frequency of Social Gatherings with Friends and Family: Not on file  . Attends Religious Services: Not on file  . Active Member of Clubs or Organizations:  Not on file  . Attends Archivist Meetings: Not on file  . Marital Status: Not on file  Intimate Partner Violence:   . Fear of Current or Ex-Partner: Not on file  . Emotionally Abused: Not on file  . Physically Abused: Not on file  . Sexually Abused: Not on file    Family History: Family History  Problem Relation Age of Onset  . Kidney cancer Father 74  . Breast cancer Neg Hx     Review of Systems: Review of Systems  Constitutional: Negative.   Respiratory: Negative.   Cardiovascular: Negative.   Gastrointestinal: Negative.   Musculoskeletal: Positive for myalgias (Left breast/pectoralis muscle spasms).  Skin: Negative.     Physical Exam: Vital Signs BP (!) 159/93 (BP Location: Left Arm, Patient Position: Sitting, Cuff Size: Large)   Pulse (!) 106   Temp 98.2 F (36.8 C) (Oral)   Ht 5\' 7"  (1.702 m)   Wt 196 lb 6.4 oz (89.1 kg)   SpO2 98%   BMI 30.76 kg/m  Physical Exam Exam conducted with a chaperone present.  Constitutional:      General: She is not in acute distress.    Appearance: Normal appearance. She is not ill-appearing.  HENT:     Head: Normocephalic and atraumatic.  Eyes:     Pupils: Pupils are equal, round Cardiovascular:     Rate and Rhythm: Normal rate and regular rhythm.     Pulses: Normal pulses.     Heart sounds: Normal heart sounds. No murmur.  Pulmonary:     Effort: Pulmonary effort is normal. No  respiratory distress.     Breath sounds: Normal breath sounds. No wheezing.  Breast: Left breast incision is clean/dry/intact, well-healed, expander in place without any overlying skin changes. Abdominal:     General: Abdomen is flat. There is no distension. .  Musculoskeletal: Normal range of motion.  Skin:    General: Skin is warm and dry.     Findings: No erythema or rash.  Neurological:     General: No focal deficit present.     Mental Status: She is alert and oriented to person, place, and time. Mental status is at baseline.     Motor: No weakness.  Psychiatric:        Mood and Affect: Mood normal.        Behavior: Behavior normal.    Assessment/Plan: Ms Norenberg is scheduled for removal of left breast expander and placement of implant and placement of right breast implant with Dr. Marla Roe.  Risks, benefits, and alternatives of procedure discussed, questions answered and consent obtained.    Smoking Status: Non smoker; Counseling Given? NA Last Mammogram: 01/11/20; Results: Calcifications noted on L breast, underwent mastectomy.  Caprini Score: 8; Risk Factors include: Age, BMI > 25, hx of breast cancer, chronically swollen L leg due to may thurner syndrome, length of planned surgery. Recommendation for mechanical and pharmacological prophylaxis. Encourage early ambulation.   Post-op Rx sent to pharmacy: norco, zofran, keflex, Valium  Patient was provided with the General Surgical Risk consent document and Pain Medication Agreement prior to their appointment.  They had adequate time to read through the risk consent documents and Pain Medication Agreement. We also discussed them in person together during this preop appointment. All of their questions were answered to their satisfaction.  Recommended calling if they have any further questions.  Risk consent form and Pain Medication Agreement to be scanned into patient's chart.  The risks that can  be encountered with and after  placement of a breast implant were discussed and include the following but not limited to these: bleeding, infection, delayed healing, anesthesia risks, skin sensation changes, injury to structures including nerves, blood vessels, and muscles which may be temporary or permanent, allergies to tape, suture materials and glues, blood products, topical preparations or injected agents, skin contour irregularities, skin discoloration and swelling, deep vein thrombosis, cardiac and pulmonary complications, pain, which may persist, fluid accumulation, wrinkling of the skin over the implanmt, changes in nipple or breast sensation, implant leakage or rupture, faulty position of the implant, persistent pain, formation of tight scar tissue around the implant (capsular contracture).   Electronically signed by: Carola Rhine Ajanay Farve, PA-C 05/07/2020 3:11 PM

## 2020-05-07 NOTE — H&P (View-Only) (Signed)
Patient ID: Margaret Farmer, female    DOB: [birth year], 64 y.o.   MRN: [id number]  Chief Complaint  Patient presents with  . Follow-up      ICD-10-CM   1. Acquired absence of left breast  Z90.12   2. Breast asymmetry following reconstructive surgery  N65.1   3. 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  female  with a history of left breast DCIS diagnosed after screening mammogram. Patient currently has a left breast expander in place.  She presents for preoperative evaluation for upcoming procedure, removal of tissue expander and placement of implant (left) and placement of right breast implant, scheduled for 05/24/20 with Dr. Marla Roe.  The patient has not had problems with anesthesia. No history of DVT/PE.  No family history of DVT/PE.  No family or personal history of bleeding or clotting disorders.  Patient is not currently taking any blood thinners.  Non smoker.  Patient currently has 330 cc/455 cc expander in the left breast.  Patient is doing well.  No fevers, chills, nausea, vomiting, dizziness, weakness.  Been feeling well lately without any issues.  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 Hx of hypertension, hypothyroidism.   Past Medical History: Allergies: No Known Allergies  Current Medications:  Current Outpatient Medications:  .  ALPRAZolam (XANAX) 0.5 MG tablet, Take 0.5 mg by mouth 3 (three) times daily as needed for anxiety., Disp: , Rfl:  .  amLODipine (NORVASC) 5 MG tablet, Take 5 mg by mouth daily., Disp: , Rfl:  .  enalapril (VASOTEC) 10 MG tablet, TAKE 1 TABLET BY MOUTH ONCE DAILY (Patient taking differently: Take 10 mg by mouth every evening. ), Disp: 30 tablet, Rfl: 5 .  escitalopram (LEXAPRO) 10 MG tablet, Take 10 mg by mouth every evening. , Disp: , Rfl:  .  Eszopiclone 3 MG TABS, Take 3 mg by mouth See admin instructions.  Every other night as needed for sleep (alternating between the Lunesta & Ambien), Disp: , Rfl: 5 .  fluticasone (FLONASE) 50 MCG/ACT nasal spray, Place 1-2 sprays into both nostrils 3 (three) times daily as needed., Disp: , Rfl:  .  lidocaine (LIDODERM) 5 %, Place 1 patch onto the skin daily as needed for pain., Disp: , Rfl:  .  Liotrix 15 (3.1-12.5) MG (MCG) TABS, Take 1 capsule by mouth daily before breakfast. , Disp: , Rfl: 0 .  montelukast (SINGULAIR) 10 MG tablet, Take 10 mg by mouth at bedtime., Disp: , Rfl:  .  spironolactone (ALDACTONE) 25 MG tablet, Take 25 mg by mouth daily as needed (leg swelling). , Disp: , Rfl: 12 .  SYNTHROID 137 MCG tablet, Take 137 mcg by mouth daily., Disp: , Rfl:  .  tiZANidine (ZANAFLEX) 4 MG tablet, Take 4 mg by mouth 3 (three) times daily as needed for spasms., Disp: , Rfl:  .  zolpidem (AMBIEN) 5 MG tablet, Take 5 mg by mouth See admin instructions. Every other night as needed for sleep (alternating between the Lunesta & Ambien), Disp: , Rfl:  .  cephALEXin (KEFLEX) 500 MG capsule, Take 1 capsule (500 mg total) by mouth 4 (four) times daily for 5 days., Disp: 20 capsule, Rfl: 0 .  diazepam (VALIUM) 2 MG tablet, Take 1 tablet (2 mg total) by mouth every 12 (twelve) hours as needed for muscle  spasms., Disp: 20 tablet, Rfl: 0 .  HYDROcodone-acetaminophen (NORCO) 5-325 MG tablet, Take 1 tablet by mouth every 6 (six) hours as needed for up to 5 days for severe pain., Disp: 20 tablet, Rfl: 0 .  lidocaine-prilocaine (EMLA) cream, Apply 1 application topically once. (Patient not taking: Reported on 05/07/2020), Disp: , Rfl:  .  ondansetron (ZOFRAN) 4 MG tablet, Take 1 tablet (4 mg total) by mouth every 8 (eight) hours as needed for nausea or vomiting. (Patient not taking: Reported on 05/07/2020), Disp: 20 tablet, Rfl: 0 .  ondansetron (ZOFRAN) 4 MG tablet, Take 1 tablet (4 mg total) by mouth every 8 (eight) hours as needed for nausea or vomiting., Disp: 20 tablet, Rfl:  0  Past Medical Problems: Past Medical History:  Diagnosis Date  . Anxiety   . Depression   . GERD (gastroesophageal reflux disease)   . Hypertension   . Hypothyroid     Past Surgical History: Past Surgical History:  Procedure Laterality Date  . BREAST BIOPSY Left 02/01/2020   affirm bx of calcs, coil marker, path pending lateral   . BREAST BIOPSY Left 02/01/2020   affirm bx of calcs, x clip, path pending lower outer  . BREAST RECONSTRUCTION WITH PLACEMENT OF TISSUE EXPANDER AND FLEX HD (ACELLULAR HYDRATED DERMIS) Left 03/14/2020   Procedure: BREAST RECONSTRUCTION WITH PLACEMENT OF TISSUE EXPANDER AND FLEX HD (ACELLULAR HYDRATED DERMIS);  Surgeon: Wallace Going, DO;  Location: [facility];  Service: Plastics;  Laterality: Left;  . COLONOSCOPY  2013  . MASTECTOMY W/ SENTINEL NODE BIOPSY Left 03/14/2020   Procedure: MASTECTOMY WITH SENTINEL LYMPH NODE BIOPSY;  Surgeon: Robert Bellow, MD;  Location: [facility];  Service: General;  Laterality: Left;  . NASAL SINUS SURGERY    . SEPTOPLASTY    . VEIN SURGERY Left 1986    Social History: Social History   Socioeconomic History  . Marital status: Single    Spouse name: Not on file  . Number of children: Not on file  . Years of education: Not on file  . Highest education level: Not on file  Occupational History  . Not on file  Tobacco Use  . Smoking status: Never Smoker  . Smokeless tobacco: Never Used  Vaping Use  . Vaping Use: Never used  Substance and Sexual Activity  . Alcohol use: Yes    Comment: rare  . Drug use: No  . Sexual activity: Not on file  Other Topics Concern  . Not on file  Social History Narrative   retd [employer]; lives in [place]; no smoking/alcohol.    Social Determinants of Health   Financial Resource Strain:   . Difficulty of Paying Living Expenses: Not on file  Food Insecurity:   . Worried About Charity fundraiser in the Last Year: Not on file  . Ran Out of Food in the Last Year: Not  on file  Transportation Needs:   . Lack of Transportation (Medical): Not on file  . Lack of Transportation (Non-Medical): Not on file  Physical Activity:   . Days of Exercise per Week: Not on file  . Minutes of Exercise per Session: Not on file  Stress:   . Feeling of Stress : Not on file  Social Connections:   . Frequency of Communication with Friends and Family: Not on file  . Frequency of Social Gatherings with Friends and Family: Not on file  . Attends Religious Services: Not on file  . Active Member of Clubs or Organizations:  Not on file  . Attends Archivist Meetings: Not on file  . Marital Status: Not on file  Intimate Partner Violence:   . Fear of Current or Ex-Partner: Not on file  . Emotionally Abused: Not on file  . Physically Abused: Not on file  . Sexually Abused: Not on file    Family History: Family History  Problem Relation Age of Onset  . Kidney cancer Father 21  . Breast cancer Neg Hx     Review of Systems: Review of Systems  Constitutional: Negative.   Respiratory: Negative.   Cardiovascular: Negative.   Gastrointestinal: Negative.   Musculoskeletal: Positive for myalgias (Left breast/pectoralis muscle spasms).  Skin: Negative.     Physical Exam: Vital Signs BP (!) 159/93 (BP Location: Left Arm, Patient Position: Sitting, Cuff Size: Large)   Pulse (!) 106   Temp 98.2 F (36.8 C) (Oral)   Ht 5\' 7"  (1.702 m)   Wt 196 lb 6.4 oz (89.1 kg)   SpO2 98%   BMI 30.76 kg/m  Physical Exam Exam conducted with a chaperone present.  Constitutional:      General: She is not in acute distress.    Appearance: Normal appearance. She is not ill-appearing.  HENT:     Head: Normocephalic and atraumatic.  Eyes:     Pupils: Pupils are equal, round Cardiovascular:     Rate and Rhythm: Normal rate and regular rhythm.     Pulses: Normal pulses.     Heart sounds: Normal heart sounds. No murmur.  Pulmonary:     Effort: Pulmonary effort is normal. No  respiratory distress.     Breath sounds: Normal breath sounds. No wheezing.  Breast: Left breast incision is clean/dry/intact, well-healed, expander in place without any overlying skin changes. Abdominal:     General: Abdomen is flat. There is no distension. .  Musculoskeletal: Normal range of motion.  Skin:    General: Skin is warm and dry.     Findings: No erythema or rash.  Neurological:     General: No focal deficit present.     Mental Status: She is alert and oriented to person, place, and time. Mental status is at baseline.     Motor: No weakness.  Psychiatric:        Mood and Affect: Mood normal.        Behavior: Behavior normal.    Assessment/Plan: Ms Hakim is scheduled for removal of left breast expander and placement of implant and placement of right breast implant with Dr. Marla Roe.  Risks, benefits, and alternatives of procedure discussed, questions answered and consent obtained.    Smoking Status: Non smoker; Counseling Given? NA Last Mammogram: 01/11/20; Results: Calcifications noted on L breast, underwent mastectomy.  Caprini Score: 8; Risk Factors include: Age, BMI > 25, hx of breast cancer, chronically swollen L leg due to may thurner syndrome, length of planned surgery. Recommendation for mechanical and pharmacological prophylaxis. Encourage early ambulation.   Post-op Rx sent to pharmacy: norco, zofran, keflex, Valium  Patient was provided with the General Surgical Risk consent document and Pain Medication Agreement prior to their appointment.  They had adequate time to read through the risk consent documents and Pain Medication Agreement. We also discussed them in person together during this preop appointment. All of their questions were answered to their satisfaction.  Recommended calling if they have any further questions.  Risk consent form and Pain Medication Agreement to be scanned into patient's chart.  The risks that can  be encountered with and after  placement of a breast implant were discussed and include the following but not limited to these: bleeding, infection, delayed healing, anesthesia risks, skin sensation changes, injury to structures including nerves, blood vessels, and muscles which may be temporary or permanent, allergies to tape, suture materials and glues, blood products, topical preparations or injected agents, skin contour irregularities, skin discoloration and swelling, deep vein thrombosis, cardiac and pulmonary complications, pain, which may persist, fluid accumulation, wrinkling of the skin over the implanmt, changes in nipple or breast sensation, implant leakage or rupture, faulty position of the implant, persistent pain, formation of tight scar tissue around the implant (capsular contracture).   Electronically signed by: Carola Rhine Shalana Jardin, PA-C 05/07/2020 3:11 PM

## 2020-05-08 ENCOUNTER — Ambulatory Visit: Payer: Medicare Other | Admitting: Plastic Surgery

## 2020-05-10 MED FILL — MONTELUKAST SOD 10 MG TAB: 10 | 90 days supply | Qty: 90 | Fill #1

## 2020-05-10 MED FILL — SPIRONOLACTONE 25 MG TABS: 25 | 90 days supply | Qty: 90 | Fill #1

## 2020-05-12 MED FILL — AMLODIPINE BESYLATE 5 MG TA: 5 | 30 days supply | Qty: 30 | Fill #4

## 2020-05-16 ENCOUNTER — Encounter (HOSPITAL_BASED_OUTPATIENT_CLINIC_OR_DEPARTMENT_OTHER): Payer: Self-pay | Admitting: Plastic Surgery

## 2020-05-16 ENCOUNTER — Other Ambulatory Visit: Payer: Self-pay

## 2020-05-17 MED FILL — GABAPENTIN 300 MG CAPSULE: 300 | 30 days supply | Qty: 60 | Fill #2

## 2020-05-19 MED FILL — ZOLPIDEM TARTRATE 5 MG TABS: 5 | 30 days supply | Qty: 15 | Fill #1

## 2020-05-22 ENCOUNTER — Encounter (HOSPITAL_BASED_OUTPATIENT_CLINIC_OR_DEPARTMENT_OTHER)
Admission: RE | Admit: 2020-05-22 | Discharge: 2020-05-22 | Disposition: A | Discharge status: Home / Self Care | Payer: Medicare Other | Source: Ambulatory Visit | Attending: Plastic Surgery | Admitting: Plastic Surgery

## 2020-05-22 ENCOUNTER — Other Ambulatory Visit (HOSPITAL_COMMUNITY)
Admission: RE | Admit: 2020-05-22 | Discharge: 2020-05-22 | Disposition: A | Discharge status: Home / Self Care | Payer: Medicare Other | Source: Ambulatory Visit | Attending: Plastic Surgery | Admitting: Plastic Surgery

## 2020-05-22 DIAGNOSIS — Z01812 Encounter for preprocedural laboratory examination: Secondary | ICD-10-CM | POA: Insufficient documentation

## 2020-05-22 DIAGNOSIS — I10 Essential (primary) hypertension: Secondary | ICD-10-CM | POA: Diagnosis not present

## 2020-05-22 DIAGNOSIS — E039 Hypothyroidism, unspecified: Secondary | ICD-10-CM | POA: Diagnosis not present

## 2020-05-22 DIAGNOSIS — N651 Disproportion of reconstructed breast: Secondary | ICD-10-CM | POA: Diagnosis not present

## 2020-05-22 DIAGNOSIS — F419 Anxiety disorder, unspecified: Secondary | ICD-10-CM | POA: Diagnosis not present

## 2020-05-22 DIAGNOSIS — Z79899 Other long term (current) drug therapy: Secondary | ICD-10-CM | POA: Diagnosis not present

## 2020-05-22 DIAGNOSIS — Z20822 Contact with and (suspected) exposure to covid-19: Secondary | ICD-10-CM | POA: Diagnosis not present

## 2020-05-22 DIAGNOSIS — Z86000 Personal history of in-situ neoplasm of breast: Secondary | ICD-10-CM | POA: Diagnosis not present

## 2020-05-22 DIAGNOSIS — Z421 Encounter for breast reconstruction following mastectomy: Secondary | ICD-10-CM | POA: Diagnosis present

## 2020-05-22 DIAGNOSIS — Z9012 Acquired absence of left breast and nipple: Secondary | ICD-10-CM | POA: Diagnosis not present

## 2020-05-22 DIAGNOSIS — F329 Major depressive disorder, single episode, unspecified: Secondary | ICD-10-CM | POA: Diagnosis not present

## 2020-05-22 DIAGNOSIS — Z79890 Hormone replacement therapy (postmenopausal): Secondary | ICD-10-CM | POA: Diagnosis not present

## 2020-05-22 LAB — BASIC METABOLIC PANEL
Anion gap: 11 (ref 5–15)
BUN: 14 mg/dL (ref 8–23)
CO2: 26 mmol/L (ref 22–32)
Calcium: 10.4 mg/dL — ABNORMAL HIGH (ref 8.9–10.3)
Chloride: 103 mmol/L (ref 98–111)
Creatinine, Ser: 0.82 mg/dL (ref 0.44–1.00)
GFR calc Af Amer: >60 mL/min (ref >60)
GFR calc non Af Amer: >60 mL/min (ref >60)
Glucose, Bld: 92 mg/dL (ref 70–99)
Potassium: 4.2 mmol/L (ref 3.5–5.1)
Sodium: 140 mmol/L (ref 135–145)

## 2020-05-22 LAB — SARS CORONAVIRUS 2 (TAT 6-24 HRS): SARS Coronavirus 2: NEGATIVE

## 2020-05-23 NOTE — Anesthesia Preprocedure Evaluation (Addendum)
Anesthesia Evaluation  Patient identified by MRN, date of birth, ID band Patient awake    Reviewed: Allergy & Precautions, NPO status , Patient's Chart, lab work & pertinent test results  History of Anesthesia Complications Negative for: history of anesthetic complications  Airway Mallampati: II  TM Distance: >3 FB Neck ROM: Full    Dental no notable dental hx. (+) Dental Advisory Given   Pulmonary neg pulmonary ROS,    Pulmonary exam normal        Cardiovascular hypertension, Pt. on medications Normal cardiovascular exam     Neuro/Psych PSYCHIATRIC DISORDERS Anxiety Depression negative neurological ROS     GI/Hepatic Neg liver ROS, GERD  ,  Endo/Other  Hypothyroidism   Renal/GU negative Renal ROS     Musculoskeletal negative musculoskeletal ROS (+)   Abdominal   Peds  Hematology negative hematology ROS (+)   Anesthesia Other Findings   Reproductive/Obstetrics                            Anesthesia Physical Anesthesia Plan  ASA: II  Anesthesia Plan: General   Post-op Pain Management:    Induction: Intravenous  PONV Risk Score and Plan: 4 or greater and Ondansetron, Dexamethasone and Midazolam  Airway Management Planned: LMA  Additional Equipment:   Intra-op Plan:   Post-operative Plan: Extubation in OR  Informed Consent: I have reviewed the patients History and Physical, chart, labs and discussed the procedure including the risks, benefits and alternatives for the proposed anesthesia with the patient or authorized representative who has indicated his/her understanding and acceptance.     Dental advisory given  Plan Discussed with: Anesthesiologist and CRNA  Anesthesia Plan Comments:        Anesthesia Quick Evaluation

## 2020-05-24 ENCOUNTER — Encounter (HOSPITAL_BASED_OUTPATIENT_CLINIC_OR_DEPARTMENT_OTHER)
Admission: RE | Disposition: A | Discharge status: Home / Self Care | Payer: Self-pay | Source: Home / Self Care | Attending: Plastic Surgery

## 2020-05-24 ENCOUNTER — Encounter (HOSPITAL_BASED_OUTPATIENT_CLINIC_OR_DEPARTMENT_OTHER): Payer: Self-pay | Admitting: Plastic Surgery

## 2020-05-24 ENCOUNTER — Ambulatory Visit (HOSPITAL_BASED_OUTPATIENT_CLINIC_OR_DEPARTMENT_OTHER): Payer: Medicare Other | Admitting: Certified Registered"

## 2020-05-24 ENCOUNTER — Ambulatory Visit (HOSPITAL_BASED_OUTPATIENT_CLINIC_OR_DEPARTMENT_OTHER)
Admission: RE | Admit: 2020-05-24 | Discharge: 2020-05-24 | Disposition: A | Discharge status: Home / Self Care | Payer: Medicare Other | Attending: Plastic Surgery | Admitting: Plastic Surgery

## 2020-05-24 ENCOUNTER — Other Ambulatory Visit: Payer: Self-pay

## 2020-05-24 DIAGNOSIS — N651 Disproportion of reconstructed breast: Secondary | ICD-10-CM | POA: Insufficient documentation

## 2020-05-24 DIAGNOSIS — E039 Hypothyroidism, unspecified: Secondary | ICD-10-CM | POA: Insufficient documentation

## 2020-05-24 DIAGNOSIS — I10 Essential (primary) hypertension: Secondary | ICD-10-CM | POA: Insufficient documentation

## 2020-05-24 DIAGNOSIS — Z86000 Personal history of in-situ neoplasm of breast: Secondary | ICD-10-CM | POA: Diagnosis not present

## 2020-05-24 DIAGNOSIS — F419 Anxiety disorder, unspecified: Secondary | ICD-10-CM | POA: Diagnosis not present

## 2020-05-24 DIAGNOSIS — Z79899 Other long term (current) drug therapy: Secondary | ICD-10-CM | POA: Insufficient documentation

## 2020-05-24 DIAGNOSIS — I871 Compression of vein: Secondary | ICD-10-CM | POA: Diagnosis not present

## 2020-05-24 DIAGNOSIS — F329 Major depressive disorder, single episode, unspecified: Secondary | ICD-10-CM | POA: Insufficient documentation

## 2020-05-24 DIAGNOSIS — Z9012 Acquired absence of left breast and nipple: Secondary | ICD-10-CM | POA: Diagnosis not present

## 2020-05-24 DIAGNOSIS — Z79890 Hormone replacement therapy (postmenopausal): Secondary | ICD-10-CM | POA: Insufficient documentation

## 2020-05-24 DIAGNOSIS — D0512 Intraductal carcinoma in situ of left breast: Secondary | ICD-10-CM | POA: Diagnosis not present

## 2020-05-24 HISTORY — PX: REMOVAL OF TISSUE EXPANDER AND PLACEMENT OF IMPLANT: SHX6457

## 2020-05-24 HISTORY — PX: PLACEMENT OF BREAST IMPLANTS: SHX6334

## 2020-05-24 HISTORY — DX: Left bundle-branch block, unspecified: I44.7

## 2020-05-24 SURGERY — REMOVAL, TISSUE EXPANDER, BREAST, WITH IMPLANT INSERTION
Anesthesia: General | Site: Breast | Laterality: Right

## 2020-05-24 MED ORDER — CELECOXIB 200 MG PO CAPS
200.0000 mg | ORAL_CAPSULE | Freq: Once | ORAL | Status: AC
  Administered 2020-05-24: 200 mg via ORAL

## 2020-05-24 MED ORDER — HYDROCODONE-ACETAMINOPHEN 5-325 MG PO TABS
1.0000 | ORAL_TABLET | Freq: Once | ORAL | Status: AC
  Administered 2020-05-24: 1 via ORAL

## 2020-05-24 MED ORDER — FENTANYL CITRATE (PF) 100 MCG/2ML IJ SOLN
INTRAMUSCULAR | Status: AC
Start: 2020-05-24 — End: ?
  Filled 2020-05-24: qty 2

## 2020-05-24 MED ORDER — FENTANYL CITRATE (PF) 100 MCG/2ML IJ SOLN
25.0000 ug | INTRAMUSCULAR | Status: DC | PRN
  Administered 2020-05-24 (×2): 25 ug via INTRAVENOUS

## 2020-05-24 MED ORDER — ONDANSETRON HCL 4 MG/2ML IJ SOLN
INTRAMUSCULAR | Status: DC | PRN
  Administered 2020-05-24: 4 mg via INTRAVENOUS

## 2020-05-24 MED ORDER — PROPOFOL 10 MG/ML IV BOLUS
INTRAVENOUS | Status: DC | PRN
  Administered 2020-05-24: 200 mg via INTRAVENOUS

## 2020-05-24 MED ORDER — ACETAMINOPHEN 500 MG PO TABS
ORAL_TABLET | ORAL | Status: AC
  Filled 2020-05-24: qty 2

## 2020-05-24 MED ORDER — MIDAZOLAM HCL 2 MG/2ML IJ SOLN
INTRAMUSCULAR | Status: AC
  Filled 2020-05-24: qty 2

## 2020-05-24 MED ORDER — SODIUM CHLORIDE 0.9 % IV SOLN
INTRAVENOUS | Status: DC | PRN
  Administered 2020-05-24: 500 mL

## 2020-05-24 MED ORDER — PROPOFOL 10 MG/ML IV BOLUS
INTRAVENOUS | Status: AC
  Filled 2020-05-24: qty 40

## 2020-05-24 MED ORDER — LIDOCAINE HCL (CARDIAC) PF 100 MG/5ML IV SOSY
PREFILLED_SYRINGE | INTRAVENOUS | Status: DC | PRN
  Administered 2020-05-24: 100 mg via INTRAVENOUS

## 2020-05-24 MED ORDER — FENTANYL CITRATE (PF) 100 MCG/2ML IJ SOLN
INTRAMUSCULAR | Status: AC
  Filled 2020-05-24: qty 2

## 2020-05-24 MED ORDER — PROMETHAZINE HCL 25 MG/ML IJ SOLN: 6.2500 mg | INTRAMUSCULAR | Status: DC | PRN

## 2020-05-24 MED ORDER — PHENYLEPHRINE HCL (PRESSORS) 10 MG/ML IV SOLN
INTRAVENOUS | Status: DC | PRN
  Administered 2020-05-24 (×2): 80 ug via INTRAVENOUS
  Administered 2020-05-24: 40 ug via INTRAVENOUS
  Administered 2020-05-24 (×2): 80 ug via INTRAVENOUS

## 2020-05-24 MED ORDER — DEXAMETHASONE SODIUM PHOSPHATE 10 MG/ML IJ SOLN
INTRAMUSCULAR | Status: DC | PRN
  Administered 2020-05-24: 5 mg via INTRAVENOUS

## 2020-05-24 MED ORDER — DEXAMETHASONE SODIUM PHOSPHATE 10 MG/ML IJ SOLN
INTRAMUSCULAR | Status: AC
  Filled 2020-05-24: qty 1

## 2020-05-24 MED ORDER — LIDOCAINE 2% (20 MG/ML) 5 ML SYRINGE
INTRAMUSCULAR | Status: AC
  Filled 2020-05-24: qty 5

## 2020-05-24 MED ORDER — HYDROCODONE-ACETAMINOPHEN 5-325 MG PO TABS
ORAL_TABLET | ORAL | Status: AC
  Filled 2020-05-24: qty 1

## 2020-05-24 MED ORDER — CELECOXIB 200 MG PO CAPS
ORAL_CAPSULE | ORAL | Status: AC
  Filled 2020-05-24: qty 1

## 2020-05-24 MED ORDER — ACETAMINOPHEN 500 MG PO TABS
1000.0000 mg | ORAL_TABLET | Freq: Once | ORAL | Status: AC
  Administered 2020-05-24: 1000 mg via ORAL

## 2020-05-24 MED ORDER — FENTANYL CITRATE (PF) 100 MCG/2ML IJ SOLN
INTRAMUSCULAR | Status: DC | PRN
Start: 2020-05-24 — End: 2020-05-24
  Administered 2020-05-24 (×4): 25 ug via INTRAVENOUS

## 2020-05-24 MED ORDER — SODIUM CHLORIDE 0.9 % IV SOLN
INTRAVENOUS | Status: AC
  Filled 2020-05-24: qty 500000

## 2020-05-24 MED ORDER — CEFAZOLIN SODIUM-DEXTROSE 2-4 GM/100ML-% IV SOLN
INTRAVENOUS | Status: AC
  Filled 2020-05-24: qty 100

## 2020-05-24 MED ORDER — LACTATED RINGERS IV SOLN: INTRAVENOUS | Status: DC

## 2020-05-24 MED ORDER — EPHEDRINE SULFATE 50 MG/ML IJ SOLN
INTRAMUSCULAR | Status: DC | PRN
  Administered 2020-05-24 (×3): 10 mg via INTRAVENOUS
  Administered 2020-05-24 (×2): 5 mg via INTRAVENOUS

## 2020-05-24 MED ORDER — MIDAZOLAM HCL 5 MG/5ML IJ SOLN
INTRAMUSCULAR | Status: DC | PRN
  Administered 2020-05-24: 2 mg via INTRAVENOUS

## 2020-05-24 MED ORDER — CEFAZOLIN SODIUM-DEXTROSE 2-4 GM/100ML-% IV SOLN
2.0000 g | INTRAVENOUS | Status: AC
  Administered 2020-05-24: 2 g via INTRAVENOUS

## 2020-05-24 MED ORDER — LIDOCAINE-EPINEPHRINE 1 %-1:100000 IJ SOLN
INTRAMUSCULAR | Status: DC | PRN
  Administered 2020-05-24: 10 mL

## 2020-05-24 MED ORDER — CHLORHEXIDINE GLUCONATE CLOTH 2 % EX PADS: 6.0000 | MEDICATED_PAD | Freq: Once | CUTANEOUS | Status: DC

## 2020-05-24 MED ORDER — ONDANSETRON HCL 4 MG/2ML IJ SOLN
INTRAMUSCULAR | Status: AC
  Filled 2020-05-24: qty 2

## 2020-05-24 SURGICAL SUPPLY — 85 items
ADH SKN CLS APL DERMABOND .7 (GAUZE/BANDAGES/DRESSINGS) ×2
BAG DECANTER FOR FLEXI CONT (MISCELLANEOUS) ×3 IMPLANT
BINDER BREAST LRG (GAUZE/BANDAGES/DRESSINGS) IMPLANT
BINDER BREAST MEDIUM (GAUZE/BANDAGES/DRESSINGS) IMPLANT
BINDER BREAST XLRG (GAUZE/BANDAGES/DRESSINGS) ×3 IMPLANT
BINDER BREAST XXLRG (GAUZE/BANDAGES/DRESSINGS) IMPLANT
BIOPATCH RED 1 DISK 7.0 (GAUZE/BANDAGES/DRESSINGS) IMPLANT
BLADE HEX COATED 2.75 (ELECTRODE) ×3 IMPLANT
BLADE SURG 15 STRL LF DISP TIS (BLADE) ×2 IMPLANT
BLADE SURG 15 STRL SS (BLADE) ×3
BNDG GAUZE ELAST 4 BULKY (GAUZE/BANDAGES/DRESSINGS) IMPLANT
CANISTER SUCT 1200ML W/VALVE (MISCELLANEOUS) ×3 IMPLANT
COVER BACK TABLE 60X90IN (DRAPES) ×3 IMPLANT
COVER MAYO STAND STRL (DRAPES) ×3 IMPLANT
COVER WAND RF STERILE (DRAPES) IMPLANT
DECANTER SPIKE VIAL GLASS SM (MISCELLANEOUS) ×3 IMPLANT
DERMABOND ADVANCED (GAUZE/BANDAGES/DRESSINGS) ×1
DERMABOND ADVANCED .7 DNX12 (GAUZE/BANDAGES/DRESSINGS) ×2 IMPLANT
DRAIN CHANNEL 19F RND (DRAIN) IMPLANT
DRAPE LAPAROSCOPIC ABDOMINAL (DRAPES) ×3 IMPLANT
DRSG OPSITE POSTOP 3X4 (GAUZE/BANDAGES/DRESSINGS) ×3 IMPLANT
DRSG OPSITE POSTOP 4X6 (GAUZE/BANDAGES/DRESSINGS) ×3 IMPLANT
DRSG PAD ABDOMINAL 8X10 ST (GAUZE/BANDAGES/DRESSINGS) ×6 IMPLANT
ELECT BLADE 4.0 EZ CLEAN MEGAD (MISCELLANEOUS) ×3
ELECT BLADE 6.5 EXT (BLADE) IMPLANT
ELECT COATED BLADE 2.86 ST (ELECTRODE) ×3 IMPLANT
ELECT REM PT RETURN 9FT ADLT (ELECTROSURGICAL) ×3
ELECTRODE BLDE 4.0 EZ CLN MEGD (MISCELLANEOUS) ×2 IMPLANT
ELECTRODE REM PT RTRN 9FT ADLT (ELECTROSURGICAL) ×2 IMPLANT
EVACUATOR SILICONE 100CC (DRAIN) IMPLANT
FUNNEL KELLER 2 DISP (MISCELLANEOUS) ×3 IMPLANT
GAUZE SPONGE 4X4 12PLY STRL LF (GAUZE/BANDAGES/DRESSINGS) IMPLANT
GLOVE BIO SURGEON STRL SZ 6.5 (GLOVE) ×6 IMPLANT
GOWN STRL REUS W/ TWL LRG LVL3 (GOWN DISPOSABLE) ×8 IMPLANT
GOWN STRL REUS W/TWL LRG LVL3 (GOWN DISPOSABLE) ×12
IMPL GEL 225CC (Breast) ×2 IMPLANT
IMPL SILICONE 480CC (Breast) ×2 IMPLANT
IMPLANT GEL 225CC (Breast) ×3 IMPLANT
IMPLANT SILICONE 480CC (Breast) ×3 IMPLANT
IV NS 1000ML (IV SOLUTION)
IV NS 1000ML BAXH (IV SOLUTION) IMPLANT
IV NS 500ML (IV SOLUTION) ×3
IV NS 500ML BAXH (IV SOLUTION) ×2 IMPLANT
KIT FILL SYSTEM UNIVERSAL (SET/KITS/TRAYS/PACK) IMPLANT
NDL SAFETY ECLIPSE 18X1.5 (NEEDLE) IMPLANT
NEEDLE HYPO 18GX1.5 SHARP (NEEDLE)
NEEDLE HYPO 25X1 1.5 SAFETY (NEEDLE) ×3 IMPLANT
NEEDLE SPNL 18GX3.5 QUINCKE PK (NEEDLE) IMPLANT
NS IRRIG 1000ML POUR BTL (IV SOLUTION) IMPLANT
PACK BASIN DAY SURGERY FS (CUSTOM PROCEDURE TRAY) ×3 IMPLANT
PENCIL SMOKE EVACUATOR (MISCELLANEOUS) ×3 IMPLANT
PIN SAFETY STERILE (MISCELLANEOUS) IMPLANT
SIZER BREAST GEL REUSE 480CC (SIZER) ×3
SIZER BREAST REUSE 200CC (SIZER) ×3
SIZER BREAST REUSE 225CC (SIZER) ×3
SIZER BREAST REUSE 350CC (SIZER) ×3
SIZER BREAST REUSE 455CC (SIZER) ×3
SIZER BRST GEL REUSE 480CC (SIZER) ×2 IMPLANT
SIZER BRST REUS P5.2XULT 350CC (SIZER) ×2 IMPLANT
SIZER BRST REUSE 200CC (SIZER) ×2 IMPLANT
SIZER BRST REUSE 225CC (SIZER) ×2 IMPLANT
SIZER BRST REUSE 455CC (SIZER) ×2 IMPLANT
SLEEVE SCD COMPRESS KNEE MED (MISCELLANEOUS) ×3 IMPLANT
SPONGE LAP 18X18 RF (DISPOSABLE) ×6 IMPLANT
STRIP SUTURE WOUND CLOSURE 1/2 (MISCELLANEOUS) ×3 IMPLANT
SUT MNCRL AB 3-0 PS2 18 (SUTURE) IMPLANT
SUT MNCRL AB 4-0 PS2 18 (SUTURE) ×6 IMPLANT
SUT MON AB 3-0 SH 27 (SUTURE) ×15
SUT MON AB 3-0 SH27 (SUTURE) ×10 IMPLANT
SUT MON AB 5-0 PS2 18 (SUTURE) ×6 IMPLANT
SUT PDS 3-0 CT2 (SUTURE) ×3
SUT PDS AB 2-0 CT2 27 (SUTURE) IMPLANT
SUT PDS II 3-0 CT2 27 ABS (SUTURE) ×2 IMPLANT
SUT SILK 3 0 PS 1 (SUTURE) IMPLANT
SUT VIC AB 3-0 SH 27 (SUTURE)
SUT VIC AB 3-0 SH 27X BRD (SUTURE) IMPLANT
SUT VICRYL 4-0 PS2 18IN ABS (SUTURE) IMPLANT
SYR 50ML LL SCALE MARK (SYRINGE) IMPLANT
SYR BULB IRRIG 60ML STRL (SYRINGE) ×3 IMPLANT
SYR CONTROL 10ML LL (SYRINGE) IMPLANT
TOWEL GREEN STERILE FF (TOWEL DISPOSABLE) ×6 IMPLANT
TRAY DSU PREP LF (CUSTOM PROCEDURE TRAY) ×3 IMPLANT
TUBE CONNECTING 20X1/4 (TUBING) ×3 IMPLANT
UNDERPAD 30X36 HEAVY ABSORB (UNDERPADS AND DIAPERS) ×6 IMPLANT
YANKAUER SUCT BULB TIP NO VENT (SUCTIONS) ×3 IMPLANT

## 2020-05-24 NOTE — Transfer of Care (Signed)
Immediate Anesthesia Transfer of Care Note  Patient: Margaret Farmer  Procedure(s) Performed: REMOVAL OF TISSUE EXPANDER AND PLACEMENT OF IMPLANT (Left Breast) PLACEMENT OF BREAST IMPLANT (Right Breast)  Patient Location: PACU  Anesthesia Type:General  Level of Consciousness: awake, alert  and oriented  Airway & Oxygen Therapy: Patient Spontanous Breathing and Patient connected to face mask oxygen  Post-op Assessment: Report given to RN and Post -op Vital signs reviewed and stable  Post vital signs: Reviewed and stable  Last Vitals:  Vitals Value Taken Time  BP 122/71 05/24/20 1109  Temp    Pulse 94 05/24/20 1110  Resp 17 05/24/20 1111  SpO2 99 % 05/24/20 1110  Vitals shown include unvalidated device data.  Last Pain:  Vitals:   05/24/20 0819  TempSrc: Oral  PainSc: 2          Complications: No complications documented.

## 2020-05-24 NOTE — Anesthesia Procedure Notes (Signed)
Procedure Name: LMA Insertion Date/Time: 05/24/2020 9:35 AM Performed by: Lavonia Dana, CRNA Pre-anesthesia Checklist: Patient identified, Emergency Drugs available, Suction available and Patient being monitored Patient Re-evaluated:Patient Re-evaluated prior to induction Oxygen Delivery Method: Circle system utilized Preoxygenation: Pre-oxygenation with 100% oxygen Induction Type: IV induction Ventilation: Mask ventilation without difficulty LMA: LMA inserted LMA Size: 4.0 Number of attempts: 1 Airway Equipment and Method: Bite block Placement Confirmation: positive ETCO2 Tube secured with: Tape Dental Injury: Teeth and Oropharynx as per pre-operative assessment

## 2020-05-24 NOTE — Discharge Instructions (Signed)
INSTRUCTIONS FOR AFTER SURGERY   You will likely have some questions about what to expect following your operation.  The following information will help you and your family understand what to expect when you are discharged from the hospital.  Following these guidelines will help ensure a smooth recovery and reduce risks of complications.  Postoperative instructions include information on: diet, wound care, medications and physical activity.  AFTER SURGERY Expect to go home after the procedure.  In some cases, you may need to spend one night in the hospital for observation.  DIET This surgery does not require a specific diet.  However, I have to mention that the healthier you eat the better your body can start healing. It is important to increasing your protein intake.  This means limiting the foods with added sugar.  Focus on fruits and vegetables and some meat. It is very important to drink water after your surgery.  If your urine is bright yellow, then it is concentrated, and you need to drink more water.  As a general rule after surgery, you should have 8 ounces of water every hour while awake.  If you find you are persistently nauseated or unable to take in liquids let us know.  NO TOBACCO USE or EXPOSURE.  This will slow your healing process and increase the risk of a wound.  WOUND CARE If you don't have a drain: You can shower the day after surgery.  Use fragrance free soap.  Dial, [place], [place] and Cetaphil are usually mild on the skin.  If you have steri-strips / tape directly attached to your skin leave them in place. It is OK to get these wet.  No baths, pools or hot tubs for two weeks. We close your incision to leave the smallest and best-looking scar. No ointment or creams on your incisions until given the go ahead.  Especially not Neosporin (Too many skin reactions with this one).  A few weeks after surgery you can use Mederma and start massaging the scar. We ask you to wear your binder or  sports bra for the first 6 weeks around the clock, including while sleeping. This provides added comfort and helps reduce the fluid accumulation at the surgery site.  ACTIVITY No heavy lifting until cleared by the doctor.  It is OK to walk and climb stairs. In fact, moving your legs is very important to decrease your risk of a blood clot.  It will also help keep you from getting deconditioned.  Every 1 to 2 hours get up and walk for 5 minutes. This will help with a quicker recovery back to normal.  Let pain be your guide so you don't do too much.  NO, you cannot do the spring cleaning and don't plan on taking care of anyone else.  This is your time for TLC.   WORK Everyone returns to work at different times. As a rough guide, most people take at least 1 - 2 weeks off prior to returning to work. If you need documentation for your job, bring the forms to your postoperative follow up visit.  DRIVING Arrange for someone to bring you home from the hospital.  You may be able to drive a few days after surgery but not while taking any narcotics or valium.  BOWEL MOVEMENTS Constipation can occur after anesthesia and while taking pain medication.  It is important to stay ahead for your comfort.  We recommend taking Milk of Magnesia (2 tablespoons; twice a day) while taking  the pain pills.  SEROMA This is fluid your body tried to put in the surgical site.  This is normal but if it creates excessive pain and swelling let us know.  It usually decreases in a few weeks.  MEDICATIONS and PAIN CONTROL At your preoperative visit for you history and physical you were given the following medications: 1. An antibiotic: Start this medication when you get home and take according to the instructions on the bottle. 2. Zofran 4 mg:  This is to treat nausea and vomiting.  You can take this every 6 hours as needed and only if needed. 3. Norco (hydrocodone/acetaminophen) 5/325 mg:  This is only to be used after you have  taken the motrin or the tylenol. Every 8 hours as needed. Over the counter Medication to take: 4. Ibuprofen (Motrin) 600 mg:  Take this every 6 hours.  If you have additional pain then take 500 mg of the tylenol.  Only take the Norco after you have tried these two. 5. Miralax or stool softener of choice: Take this according to the bottle if you take the [place] Call your surgeon's office if any of the following occur: . Fever 101 degrees F or greater . Excessive bleeding or fluid from the incision site. . Pain that increases over time without aid from the medications . Redness, warmth, or pus draining from incision sites . Persistent nausea or inability to take in liquids . Severe misshapen area that underwent the operation.   Post Anesthesia Home Care Instructions  Activity: Get plenty of rest for the remainder of the day. A responsible individual must stay with you for 24 hours following the procedure.  For the next 24 hours, DO NOT: -Drive a car -Paediatric nurse -Drink alcoholic beverages -Take any medication unless instructed by your physician -Make any legal decisions or sign important papers.  Meals: Start with liquid foods such as gelatin or soup. Progress to regular foods as tolerated. Avoid greasy, spicy, heavy foods. If nausea and/or vomiting occur, drink only clear liquids until the nausea and/or vomiting subsides. Call your physician if vomiting continues.  Special Instructions/Symptoms: Your throat may feel dry or sore from the anesthesia or the breathing tube placed in your throat during surgery. If this causes discomfort, gargle with warm salt water. The discomfort should disappear within 24 hours.  If you had a scopolamine patch placed behind your ear for the management of post- operative nausea and/or vomiting:  1. The medication in the patch is effective for 72 hours, after which it should be removed.  Wrap patch in a tissue and discard in the trash.  Wash hands thoroughly with soap and water. 2. You may remove the patch earlier than 72 hours if you experience unpleasant side effects which may include dry mouth, dizziness or visual disturbances. 3. Avoid touching the patch. Wash your hands with soap and water after contact with the patch.     May take tylenol after 2pm, if needed May take ibuprofen after 2pm, if needed

## 2020-05-24 NOTE — Interval H&P Note (Signed)
History and Physical Interval Note:  05/24/2020 9:00 AM  Margaret Farmer  has presented today for surgery, with the diagnosis of history of left breast cancer.  The various methods of treatment have been discussed with the patient and family. After consideration of risks, benefits and other options for treatment, the patient has consented to  Procedure(s): REMOVAL OF TISSUE EXPANDER AND PLACEMENT OF IMPLANT (Left) PLACEMENT OF BREAST IMPLANT (Right) as a surgical intervention.  The patient's history has been reviewed, patient examined, no change in status, stable for surgery.  I have reviewed the patient's chart and labs.  Questions were answered to the patient's satisfaction.     Loel Lofty Gerlad Pelzel

## 2020-05-24 NOTE — Anesthesia Postprocedure Evaluation (Signed)
Anesthesia Post Note  Patient: Margaret Farmer  Procedure(s) Performed: REMOVAL OF TISSUE EXPANDER AND PLACEMENT OF IMPLANT (Left Breast) PLACEMENT OF BREAST IMPLANT (Right Breast)     Patient location during evaluation: PACU Anesthesia Type: General Level of consciousness: sedated Pain management: pain level controlled Vital Signs Assessment: post-procedure vital signs reviewed and stable Respiratory status: spontaneous breathing and respiratory function stable Cardiovascular status: stable Postop Assessment: no apparent nausea or vomiting Anesthetic complications: no   No complications documented.  Last Vitals:  Vitals:   05/24/20 1145 05/24/20 1215  BP: 129/81 124/69  Pulse: 85 86  Resp: 13 16  Temp:  36.7 C  SpO2: 96% 96%    Last Pain:  Vitals:   05/24/20 1215  TempSrc:   PainSc: 5                  Terriyah Westra DANIEL

## 2020-05-24 NOTE — Op Note (Signed)
Op report Bilateral Exchange   DATE OF OPERATION: 05/24/2020  LOCATION: [place]  SURGICAL DIVISION: Plastic Surgery  PREOPERATIVE DIAGNOSIS:  1. History of left breast cancer.  2. Acquired absence of left breast.  3. Right breast asymmetry after breast reconstruction.  POSTOPERATIVE DIAGNOSIS:  1. History of left breast cancer.  2. Acquired absence of left breast.  3. Right breast asymmetry after breast reconstruction.  PROCEDURE:  1. Left exchange of tissue expander for implant.  2. Left capsulotomies for implant respositioning. 3. Placement of right breast implant for symmetry.  SURGEON: Lindy Garczynski Sanger Raylin Winer, DO  ASSISTANT: Phoebe Sharps, PA  ANESTHESIA:  General.   COMPLICATIONS: None.   IMPLANTS: Left - [employer]or Smooth Round Ultra High Profile Gel 480cc. Ref #[id number].  Serial Number [id number] Right - [employer] Smooth Round High Profile Gel 225cc. Ref #[id number].  Serial Number [id number]  INDICATIONS FOR PROCEDURE:  The patient, Margaret Farmer, is a 64 y.o. female born on March 23, 1956, is here for treatment after a left mastectomy.  She had tissue expanders placed at the time of mastectomies. She now presents for exchange of her expander for an implant.  She requires capsulotomies to better position the implants. She would also like a right breast implant for better symmetry.  MRN: [id number]  CONSENT:  Informed consent was obtained directly from the patient. Risks, benefits and alternatives were fully discussed. Specific risks including but not limited to bleeding, infection, hematoma, seroma, scarring, pain, implant infection, implant extrusion, capsular contracture, asymmetry, wound healing problems, and need for further surgery were all discussed. The patient did have an ample opportunity to have her questions answered to her satisfaction.   DESCRIPTION OF PROCEDURE:  The patient was taken to the operating room. SCDs were placed and IV  antibiotics were given. The patient's chest was prepped and draped in a sterile fashion. A time out was performed and the implants to be used were identified.    On the left breast: The old mastectomy scar was incised.  The mastectomy flaps from the superior and inferior flaps were raised over the pectoralis major muscle for several centimeters to minimize tension for the closure. The juncture between the pectoralis muscle and the ADM was split inferior to the skin incision to expose and remove the tissue expander.  Inspection of the pocket showed a normal healthy capsule and good integration of the biologic matrix.   Capsulotomies were performed to allow for breast pocket expansion particularly superior and superior lateral.  Measurements were made and a sizer utilized to confirm adequate pocket size for the implant dimensions.  The 350 cc sizer was placed.  This was extremely small and did not look like she had any breasts.  We went one size up and it was not any larger than the right breast.  The decision was made to go with the 480 cc sizer which had a nice appearance and contour.  Hemostasis was ensured with the electrocautery.  New gloves were applied. The implant was soaked in antibiotic solution and placed in the pocket and oriented appropriately. The pectoralis major muscle and capsule on the anterior surface were re-closed with a 3-0 Monocryl suture.  Figure-of-eight interrupted sutures were used. The remaining skin was closed with 4-0 Monocryl deep dermal and 5-0 Monocryl subcuticular stitches.    On the right breast: One percent Lidocaine with epinephrine was used to infiltrate at the inframammary fold.  The bovie was used to dissect down to the pectoralis major muscle.  The  muscle lateral edge was identified.  The bovie was used to free the muscle from the chest wall.  Measurements were made and a sizer used to confirm adequate pocket size for the implant dimensions.  Hemostasis was ensured with  electrocautery. New gloves were placed. The implant was soaked in antibiotic solution and then placed in the pocket and oriented appropriately. The pectoralis major muscle and capsule on the anterior surface were re-closed with a 3-0 Monocryl suture. The remaining skin was closed with 4-0 Monocryl deep dermal and 5-0 Monocryl subcuticular stitches. Dermabond was applied to the incision site. A breast binder and ABDs were placed.  The patient was allowed to wake from anesthesia and taken to the recovery room in satisfactory condition.   The advanced practice practitioner (APP) assisted throughout the case.  The APP was essential in retraction and counter traction when needed to make the case progress smoothly.  This retraction and assistance made it possible to see the tissue plans for the procedure.  The assistance was needed for blood control, tissue re-approximation and assisted with closure of the incision site.

## 2020-05-25 ENCOUNTER — Encounter (HOSPITAL_BASED_OUTPATIENT_CLINIC_OR_DEPARTMENT_OTHER): Payer: Self-pay | Admitting: Plastic Surgery

## 2020-05-29 ENCOUNTER — Encounter: Payer: Medicare Other | Admitting: Plastic Surgery

## 2020-06-01 ENCOUNTER — Ambulatory Visit (INDEPENDENT_AMBULATORY_CARE_PROVIDER_SITE_OTHER): Payer: Medicare Other | Admitting: Plastic Surgery

## 2020-06-01 ENCOUNTER — Other Ambulatory Visit: Payer: Self-pay

## 2020-06-01 ENCOUNTER — Encounter: Payer: Self-pay | Admitting: Plastic Surgery

## 2020-06-01 VITALS — BP 157/89 | HR 106 | Temp 98.2°F

## 2020-06-01 DIAGNOSIS — Z9012 Acquired absence of left breast and nipple: Secondary | ICD-10-CM

## 2020-06-01 DIAGNOSIS — N651 Disproportion of reconstructed breast: Secondary | ICD-10-CM

## 2020-06-01 MED ORDER — DIAZEPAM 2 MG PO TABS: 2.0000 mg | ORAL_TABLET | Freq: Two times a day (BID) | ORAL | 0 refills | Status: AC | PRN

## 2020-06-01 MED ORDER — HYDROCODONE-ACETAMINOPHEN 5-325 MG PO TABS
1.0000 | ORAL_TABLET | Freq: Two times a day (BID) | ORAL | 0 refills | Status: AC | PRN
Start: 2020-06-01 — End: 2020-06-08

## 2020-06-01 NOTE — Progress Notes (Signed)
The patient is a 64 year old female here for follow-up after undergoing breast surgery.  She had the left expander removed and an implant placed.  An implant was placed on the right side for improved symmetry.  She has a little bit of numbness in her left arm.  Overall she is doing extremely well.  No sign of infection.  Incision healing nicely.  She is a little bit tight on the left and this will take some time to relax.  I Georgina Peer keep the dressing in place.  She can remove it in the shower if she wants otherwise I like to remove it at her next visit.  We will plan to see her back in 1 to 2 weeks.  Call with any questions or concerns.  She is still having some pain so I will refill her Valium and her Norco.

## 2020-06-08 ENCOUNTER — Encounter: Payer: Medicare Other | Admitting: Surgical

## 2020-06-08 ENCOUNTER — Other Ambulatory Visit: Payer: Self-pay | Admitting: Internal Medicine

## 2020-06-11 MED FILL — AMLODIPINE BESYLATE 5 MG TA: 5 | 30 days supply | Qty: 30 | Fill #5

## 2020-06-14 ENCOUNTER — Other Ambulatory Visit (HOSPITAL_COMMUNITY): Payer: Self-pay | Admitting: Internal Medicine

## 2020-06-14 MED FILL — SYNTHROID 137 MCG TABLET: 137 | 90 days supply | Qty: 90 | Fill #0

## 2020-06-22 ENCOUNTER — Ambulatory Visit (INDEPENDENT_AMBULATORY_CARE_PROVIDER_SITE_OTHER): Payer: Medicare Other | Admitting: Plastic Surgery

## 2020-06-22 ENCOUNTER — Encounter: Payer: Self-pay | Admitting: Plastic Surgery

## 2020-06-22 ENCOUNTER — Other Ambulatory Visit: Payer: Self-pay

## 2020-06-22 VITALS — BP 140/80 | HR 63 | Temp 98.4°F | Wt 190.0 lb

## 2020-06-22 DIAGNOSIS — Z9012 Acquired absence of left breast and nipple: Secondary | ICD-10-CM

## 2020-06-22 DIAGNOSIS — N651 Disproportion of reconstructed breast: Secondary | ICD-10-CM

## 2020-06-22 NOTE — Progress Notes (Signed)
   Subjective:    Patient ID: Margaret Farmer, female    DOB: 1955-11-13, 64 y.o.   MRN: [id number]  Patient is a very sweet 64 year old female here with her twin sister for follow-up on her breast surgery.  She has bilateral implants in place.  The right side was for symmetry.  The left side was the reconstruction.  She has a lot of soreness of the right lateral breast at the incision site.  The left breast gets bigger and smaller over time with the fullness in the upper outer quadrant.  I do not see any sign of hematoma or seroma today.  There is definitely a fullness.  There is no sign of infection in the incisions all are all healing nicely.     Review of Systems  Constitutional: Negative.   HENT: Negative.   Eyes: Negative.   Respiratory: Negative.   Cardiovascular: Negative.   Endocrine: Negative.   Genitourinary: Negative.        Objective:   Physical Exam Vitals and nursing note reviewed.  Constitutional:      Appearance: Normal appearance.  HENT:     Head: Normocephalic and atraumatic.  Cardiovascular:     Rate and Rhythm: Normal rate.     Pulses: Normal pulses.  Pulmonary:     Effort: Pulmonary effort is normal.  Neurological:     General: No focal deficit present.     Mental Status: She is alert.  Psychiatric:        Mood and Affect: Mood normal.        Behavior: Behavior normal.       Assessment & Plan:     ICD-10-CM   1. Acquired absence of left breast  Z90.12   2. Breast asymmetry following reconstructive surgery  N65.1       I would like her to massage the area.  Keep an eye on it.  I definitely want to see her back in a month.  If the area does not improve or gets worse or continues to change I would like to do an ultrasound on the left breast.  The patient knows to call sooner if needed.  Patient agrees with the plan.

## 2020-06-23 ENCOUNTER — Other Ambulatory Visit (HOSPITAL_COMMUNITY): Payer: Medicare Other

## 2020-07-06 ENCOUNTER — Encounter: Payer: Medicare Other | Admitting: Plastic Surgery

## 2020-07-10 MED FILL — METOPROLOL TARTRATE 25 MG T: 25 | 30 days supply | Qty: 60 | Fill #1

## 2020-07-10 MED FILL — AMLODIPINE BESYLATE 5 MG TA: 5 | 30 days supply | Qty: 30 | Fill #6

## 2020-07-10 MED FILL — ENALAPRIL MALEATE 10 MG TAB: 10 | 90 days supply | Qty: 90 | Fill #1

## 2020-07-10 MED FILL — ZOLPIDEM TARTRATE 5 MG TABS: 5 | 30 days supply | Qty: 15 | Fill #2

## 2020-07-10 MED FILL — tiZANidine HCL 4 MG TABS: 4 | 90 days supply | Qty: 270 | Fill #1

## 2020-07-20 ENCOUNTER — Encounter: Payer: Medicare Other | Admitting: Surgical

## 2020-07-24 ENCOUNTER — Ambulatory Visit: Payer: Medicare Other | Admitting: Plastic Surgery

## 2020-07-24 ENCOUNTER — Other Ambulatory Visit: Payer: Self-pay

## 2020-07-24 ENCOUNTER — Encounter: Payer: Self-pay | Admitting: Plastic Surgery

## 2020-07-24 ENCOUNTER — Ambulatory Visit (INDEPENDENT_AMBULATORY_CARE_PROVIDER_SITE_OTHER): Payer: Medicare Other | Admitting: Plastic Surgery

## 2020-07-24 VITALS — BP 146/86 | HR 83 | Temp 98.1°F

## 2020-07-24 DIAGNOSIS — Z9012 Acquired absence of left breast and nipple: Secondary | ICD-10-CM

## 2020-07-24 DIAGNOSIS — M259 Joint disorder, unspecified: Secondary | ICD-10-CM | POA: Insufficient documentation

## 2020-07-24 DIAGNOSIS — T8544XA Capsular contracture of breast implant, initial encounter: Secondary | ICD-10-CM

## 2020-07-24 NOTE — Progress Notes (Signed)
The patient is a 64 year old female here with her twin sister for a follow-up on her breast reconstruction.  The implant is resting a bit high.  She is also getting a little contracture of the skin at the central portion of her breast at the nipple areola.  She is finding it a little difficult to raise her left arm as well.  We talked about massage to the left breast capsule.  Also a massage in the downward direction to encourage the implant not to raise up any higher.  We talked about the possibility for additional surgery to release the contracture and we may also need to do some fat grafting to prevent the recurrence of the contracture at the nipple areola.  I would like to see her back when she finishes that physical therapy.  Pictures were obtained of the patient and placed in the chart with the patient's or guardian's permission.

## 2020-07-26 ENCOUNTER — Telehealth: Payer: Self-pay

## 2020-07-26 NOTE — Telephone Encounter (Signed)
Patient called to let us know that Dr. Marla Roe asked her to have two months of physical therapy

## 2020-07-26 NOTE — Telephone Encounter (Signed)
Patient called to let us know that Dr. Marla Roe asked her to have two months of physical therapy but they can't get her in until January.  She said that Dr. Marla Roe was talking about doing a release in January, so she isn't sure what she should do.  Please let her know if she should go ahead and schedule her physical therapy to start in January.

## 2020-08-03 ENCOUNTER — Encounter: Payer: Medicare Other | Admitting: Plastic Surgery

## 2020-08-08 MED FILL — SPIRONOLACTONE 25 MG TABS: 25 | 90 days supply | Qty: 90 | Fill #2

## 2020-08-08 MED FILL — MONTELUKAST SOD 10 MG TAB: 10 | 90 days supply | Qty: 90 | Fill #2

## 2020-08-12 MED FILL — ZOLPIDEM TARTRATE 5 MG TABS: 5 | 30 days supply | Qty: 15 | Fill #3

## 2020-08-12 MED FILL — METOPROLOL TARTRATE 25 MG T: 25 | 30 days supply | Qty: 60 | Fill #2

## 2020-08-13 ENCOUNTER — Other Ambulatory Visit: Payer: Self-pay | Admitting: Unknown Physician Specialty

## 2020-08-13 MED FILL — GABAPENTIN 300 MG CAPSULE: 300 | 30 days supply | Qty: 60 | Fill #3

## 2020-08-13 MED FILL — AMLODIPINE BESYLATE 5 MG TA: 5 | 30 days supply | Qty: 30 | Fill #0

## 2020-08-14 NOTE — Telephone Encounter (Signed)
Call returned to pt- no answer- left v/m with the following instructions: Per Dr. Marla Roe- she can continue with the planned PT in Jan 2022. Dr. Marla Roe indicated that our f/u visits and further care will not interfere with her PT sessions or any plan of care- with Dr. Marla Roe going forward. I did instruct her to call for any other concerns or questions, otherwise she has a f/u with Dr. Marla Roe on 09/18/20

## 2020-08-22 ENCOUNTER — Ambulatory Visit: Payer: Medicare Other | Attending: Plastic Surgery | Admitting: Physical Therapy

## 2020-08-22 ENCOUNTER — Encounter: Payer: Self-pay | Admitting: Physical Therapy

## 2020-08-22 ENCOUNTER — Other Ambulatory Visit: Payer: Self-pay

## 2020-08-22 DIAGNOSIS — R293 Abnormal posture: Secondary | ICD-10-CM | POA: Insufficient documentation

## 2020-08-22 DIAGNOSIS — M25612 Stiffness of left shoulder, not elsewhere classified: Secondary | ICD-10-CM | POA: Diagnosis present

## 2020-08-22 NOTE — Therapy (Signed)
[facility] [address] [place], [address] Phone: [phone]   Fax:  [phone]  Physical Therapy Evaluation  Patient Details  Name: TORREY HORSEMAN MRN: [id number] Date of Birth: 21-Jan-1956 No data recorded  Encounter Date: 08/22/2020   PT End of Session - 08/22/20 1601    Visit Number 1    Number of Visits 13    Date for PT Re-Evaluation 10/05/20    Authorization - Visit Number 1    Authorization - Number of Visits 10    PT Start Time 0315    PT Stop Time 0400    PT Time Calculation (min) 45 min    Activity Tolerance Patient tolerated treatment well    Behavior During Therapy [facility] for tasks assessed/performed           Past Medical History:  Diagnosis Date  . Anxiety   . Depression   . GERD (gastroesophageal reflux disease)   . Hypertension   . Hypothyroid   . LBBB (left bundle branch block) >73yrs   workup was normal    Past Surgical History:  Procedure Laterality Date  . BREAST BIOPSY Left 02/01/2020   affirm bx of calcs, coil marker, path pending lateral   . BREAST BIOPSY Left 02/01/2020   affirm bx of calcs, x clip, path pending lower outer  . BREAST RECONSTRUCTION WITH PLACEMENT OF TISSUE EXPANDER AND FLEX HD (ACELLULAR HYDRATED DERMIS) Left 03/14/2020   Procedure: BREAST RECONSTRUCTION WITH PLACEMENT OF TISSUE EXPANDER AND FLEX HD (ACELLULAR HYDRATED DERMIS);  Surgeon: Wallace Going, DO;  Location: [facility];  Service: Plastics;  Laterality: Left;  . COLONOSCOPY  2013  . MASTECTOMY W/ SENTINEL NODE BIOPSY Left 03/14/2020   Procedure: MASTECTOMY WITH SENTINEL LYMPH NODE BIOPSY;  Surgeon: Robert Bellow, MD;  Location: [facility];  Service: General;  Laterality: Left;  . NASAL SINUS SURGERY    . PLACEMENT OF BREAST IMPLANTS Right 05/24/2020   Procedure: PLACEMENT OF BREAST IMPLANT;  Surgeon: Wallace Going, DO;  Location: [facility];  Service: Plastics;   Laterality: Right;  . REMOVAL OF TISSUE EXPANDER AND PLACEMENT OF IMPLANT Left 05/24/2020   Procedure: REMOVAL OF TISSUE EXPANDER AND PLACEMENT OF IMPLANT;  Surgeon: Wallace Going, DO;  Location: [place];  Service: Plastics;  Laterality: Left;  . SEPTOPLASTY    . VEIN SURGERY Left 1986    There were no vitals filed for this visit.       OBJECTIVE  MUSCULOSKELETAL: Tremor: Normal Bulk: Normal Tone: Normal  Cervical Screen AROM: WFL and painless with overpressure in all planes Spurlings Negative bilat  Elbow Screen Elbow AROM: WNL bilat  Palpation TTP at L pec major/minor. Scar tissue adhesions around areloa  Strength R/L 5/5 Shoulder flexion (anterior deltoid/pec major/coracobrachialis, axillary n. (C5-6) and musculocutaneous n. (C5-7)) 5/5 Shoulder abduction (deltoid/supraspinatus, axillary/suprascapular n, C5) 5/5 Shoulder external rotation (infraspinatus/teres minor) 5/5 Shoulder internal rotation (subcapularis/lats/pec major) 5/5 Shoulder extension (posterior deltoid, lats, teres major, axillary/thoracodorsal n.) 5/5 Shoulder horizontal abduction 5/5 Elbow flexion (biceps brachii, brachialis, brachioradialis, musculoskeletal n, C5-6) 5/5 Elbow extension (triceps, radial n, C7) 5/5 Wrist Extension 5/5 Wrist Flexion 5/5 Finger adduction (interossei, ulnar n, T1)  AROM R/L All motion WNL and pain free bilat *Indicates pain, overpressure performed unless otherwise indicated  PROM R/L All WNL w/o pain  *Indicates pain, overpressure performed unless otherwise indicated  Accessory Motions/Glides Glenohumeral: Posterior: Normal bilat Inferior: Normal bilat Anterior: Normal  bilat  Acromioclavicular:  Posterior: Normal bilat Anterior: Normal bilat  Scapulothoracic: All normal with good scapulohumeral rhythm with overhead motions   NEUROLOGICAL:  Mental Status Patient is oriented to person, place and time.  Recent memory is  intact.  Remote memory is intact.  Attention span and concentration are intact.  Expressive speech is intact.  Patient's fund of knowledge is within normal limits for educational level.  Cranial Nerves Visual acuity and visual fields are intact  Extraocular muscles are intact  Facial sensation is intact bilaterally  Facial strength is intact bilaterally  Hearing is normal as tested by gross conversation Palate elevates midline, normal phonation  Shoulder shrug strength is intact  Tongue protrudes midline   Sensation Grossly intact to light touch bilateral UE as determined by testing dermatomes C2-T2 Proprioception and hot/cold testing deferred on this date   SPECIAL TESTS  Rotator Cuff  Drop Arm Test:  Painful Arc (Pain from 60 to 120 degrees scaption): Negative bilat Infraspinatus Muscle Test: Negative bilat If all 3 tests positive, the probability of a full-thickness rotator cuff tear is 91%  Subacromial Impingement Hawkins-Kennedy: Negative bilat Neer (Block scapula, PROM flexion): Negative bilat Painful Arc (Pain from 60 to 120 degrees scaption): Negative bilat External Rotation Resistance:Negative bilat  Functional Movement Posture FHRS with increased thoracic kyphosis  Box carry 3ft 35# with ease;  60# with difficulty maintaining scapular retraction, poor body mechanics   Ther-Ex PT reviewed the following HEP with patient with patient able to demonstrate a set of the following with min cuing for correction needed. PT educated patient on parameters of therex (how/when to inc/decrease intensity, frequency, rep/set range, stretch hold time, and purpose of therex) with verbalized understanding.  Access Code: [email or web address] Doorway Pec Stretch at 120 Degrees Abduction - 3 x daily - 7 x weekly - 63min hold Seated Thoracic Lumbar Extension with Pectoralis Stretch - 2-3 x daily - 7 x weekly - 12 reps - 3-5sec hold Self massage 58mins 3x/day Postural correction  Subjective  Assessment - 08/22/20 1517    Pertinent History Diagnosed April 2021, completed a masectomy March 14, 2020 with expander placed, without other treatments. Patient had implant placed 05/16/20 and returned to Dr. Marla Roe 05/24/20 with contracture and folding at tissue around aerola. Reports she has maintained good shoulder mobility. Since visit with Dr. Keturah Barre thinks the implant has lowered slightly and has decreased tension. Reports tension at upper chest that can be painful 3/10 that feels like a cramp after she chops small trees, is cleaning floor or windows repeatedly, lifting and carrying heavy objects (like a case of water), and with pushing/pulling for sweeping/mopping. Pt is retired and lives on 5 acres with her sister and she maintains this land. She does a lot of landscaping, taking down her own trees on the land and gardening. Pt denies N/V, B&B changes, unexplained weight fluctuation, saddle paresthesia, fever, night sweats, or unrelenting night pain at this time.    Limitations Lifting;House hold activities    How long can you sit comfortably? unlimited    How long can you stand comfortably? unlimited    How long can you walk comfortably? unlimited    Diagnostic tests none to date    Patient Stated Goals Reduce tension in chest and release adhesions and scar tissue    Currently in Pain? Yes    Pain Score 0-No pain    Pain Location Chest    Pain Orientation Left;Upper    Pain Descriptors / Indicators Cramping  Pain Type Chronic pain    Pain Onset More than a month ago    Pain Frequency Intermittent    Aggravating Factors  pushing/pulling, repeated cleaning, carry heavy things    Pain Relieving Factors rest    Effect of Pain on Daily Activities unable to complete yard work  and heavy household tasks without pain                          Objective measurements completed on examination: See above findings.               PT Education - 08/22/20 1528    Education  Details Patient was educated on diagnosis, anatomy and pathology involved, prognosis, role of PT, and was given an HEP, demonstrating exercise with proper form following verbal and tactile cues, and was given a paper hand out to continue exercise at home. Pt was educated on and agreed to plan of care.    Person(s) Educated Patient    Methods Explanation;Demonstration;Verbal cues;Tactile cues    Comprehension Verbalized understanding;Returned demonstration;Verbal cues required;Tactile cues required            PT Short Term Goals - 08/22/20 1624      PT SHORT TERM GOAL #1   Title Pt will be independent with HEP in order to improve strength and decrease pain in order to improve pain-free function at home and work.    Baseline 08/22/20 HEP given    Time 4    Period Weeks    Status New             PT Long Term Goals - 08/22/20 1628      PT LONG TERM GOAL #1   Title Patient will be able to complete sweeping/mopping pain free in order to demonstrate clinically significant decrease in pain for heavy household tasks    Baseline 08/22/20 3/10 pain    Time 6    Period Weeks    Status New      PT LONG TERM GOAL #2   Title Patient will demonstrate neutral sitting posture with no cuing or correction in order to demonstrate proper length tension relationship of upper crossed musculature.    Baseline 08/22/20 forward head rounded shoulders    Time 6    Period Weeks    Status New                  Plan - 08/22/20 1603    Clinical Impression Statement Pt is a 65 year old female presenting with contracture of L breast following masectomy + reconstruction/implant placement. Patient with impairments in pec minor/major tension, pain at end range shoulder flexion/abd, pain with heavy lifting, skin and soft tissue contractures of L breast and postural abnormalities. Activity limitations in sweeping/mopping, lifting and carrying, chopping, and reaching with bending forward; inhibiting full  participation in outdoor landscaping/gardening and heavy household ADLs. Pt will benefit from skilled PT to address aforementioned impairments to return to optimal PLOF    Personal Factors and Comorbidities Age;Comorbidity 1;Comorbidity 2;Comorbidity 3+    Comorbidities A&D, GERD, HTN, prior surgery    Examination-Activity Limitations Lift;Carry;Reach Overhead;Other   cleaning   Examination-Participation Restrictions Estate agent;Yard Work    [employer] officer Evolving/Moderate complexity    Clinical Decision Making Moderate    Rehab Potential Good    PT Frequency 2x / week    PT Duration 6 weeks    PT Treatment/Interventions Moist Heat;Electrical Stimulation;Therapeutic activities;Neuromuscular re-education;Manual  techniques;Cryotherapy;Parrafin;Fluidtherapy;Iontophoresis 4mg /ml Dexamethasone;Contrast Bath;Patient/family education;Passive range of motion;Dry needling;Compression bandaging;Taping;Joint Manipulations;Spinal Manipulations    PT Next Visit Plan STM    PT Home Exercise Plan doorway pec stretch, thoracic ext with chest stretch, postural awareness, self massge    Consulted and Agree with Plan of Care Patient           Patient will benefit from skilled therapeutic intervention in order to improve the following deficits and impairments:  Pain, Postural dysfunction, Impaired UE functional use, Impaired flexibility, Increased fascial restricitons, Decreased activity tolerance, Improper body mechanics, Impaired tone, Decreased scar mobility, Decreased mobility, Decreased skin integrity, Increased muscle spasms  Visit Diagnosis: Abnormal posture  Stiffness of left shoulder, not elsewhere classified     Problem List Patient Active Problem List   Diagnosis Date Noted  . Shoulder joint dysfunction 07/24/2020  . 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 Breast asymmetry following reconstructive surgery 04/17/2020  . Acquired absence  of breast 03/20/2020  . 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 Acute bronchitis due to other specified organisms 09/09/2017  . Dermatofibroma of calf 09/09/2017  . Lymphedema 07/07/2016  . May-Thurner syndrome 07/07/2016  . Pain in limb 07/07/2016  . Chronic venous insufficiency 07/07/2016  . Essential hypertension 07/07/2016  . Arm mass 02/27/2016   Durwin Reges DPT Durwin Reges 08/22/2020, 4:37 PM  [facility] [address], [place], [address] Phone: [phone]   Fax:  [phone]  Name: HOPIE PELLEGRIN MRN: [id number] Date of Birth: 1956-03-18

## 2020-08-23 LAB — COLOGUARD: COLOGUARD: NEGATIVE

## 2020-08-24 MED FILL — ZOLPIDEM TART ER 6.25 MG TA: 6.25 | 90 days supply | Qty: 90 | Fill #0

## 2020-08-27 ENCOUNTER — Ambulatory Visit: Payer: Medicare Other | Admitting: Physical Therapy

## 2020-08-27 ENCOUNTER — Other Ambulatory Visit: Payer: Self-pay | Admitting: Internal Medicine

## 2020-08-29 ENCOUNTER — Ambulatory Visit: Payer: Medicare Other | Admitting: Physical Therapy

## 2020-09-04 ENCOUNTER — Ambulatory Visit: Payer: Medicare Other | Admitting: Physical Therapy

## 2020-09-06 MED FILL — AMLODIPINE BESYLATE 5 MG TA: 5 | 30 days supply | Qty: 30 | Fill #1

## 2020-09-06 MED FILL — SYNTHROID 137 MCG TABLET: 137 | 90 days supply | Qty: 90 | Fill #1

## 2020-09-11 MED FILL — METOPROLOL TARTRATE 25 MG T: 25 | 30 days supply | Qty: 60 | Fill #3

## 2020-09-13 ENCOUNTER — Other Ambulatory Visit: Payer: Self-pay | Admitting: Unknown Physician Specialty

## 2020-09-13 ENCOUNTER — Encounter: Payer: Medicare Other | Admitting: Physical Therapy

## 2020-09-17 ENCOUNTER — Ambulatory Visit: Payer: Medicare Other | Admitting: Physical Therapy

## 2020-09-18 ENCOUNTER — Ambulatory Visit (INDEPENDENT_AMBULATORY_CARE_PROVIDER_SITE_OTHER): Payer: Medicare Other | Admitting: Plastic Surgery

## 2020-09-18 ENCOUNTER — Other Ambulatory Visit: Payer: Self-pay

## 2020-09-18 ENCOUNTER — Encounter: Payer: Self-pay | Admitting: Plastic Surgery

## 2020-09-18 VITALS — BP 138/80 | HR 97 | Temp 98.1°F

## 2020-09-18 DIAGNOSIS — N651 Disproportion of reconstructed breast: Secondary | ICD-10-CM | POA: Diagnosis not present

## 2020-09-18 DIAGNOSIS — T8544XA Capsular contracture of breast implant, initial encounter: Secondary | ICD-10-CM | POA: Diagnosis not present

## 2020-09-18 NOTE — Progress Notes (Signed)
   Subjective:    Patient ID: Margaret Farmer, female    DOB: Dec 24, 1955, 65 y.o.   MRN: [id number]  The patient is a 65 year old female here with her sister for follow-up on her breast reconstruction.  She had a left mastectomy with expander placement in June 2021 she then had exchange and placement of bilateral expanders in September 2021.  She has a left Mentor smooth round ultra high profile gel 480 cc implant and a right Mentor smooth round high-profile gel 225 cc implant.  She is happy with the symmetry and size.  She has some contracture of the left breast that is creating significant wrinkling and rippling of the nipple areola.  No sign of seroma or hematoma.     Review of Systems  Constitutional: Negative.  Negative for activity change.  Eyes: Negative.   Respiratory: Negative.   Cardiovascular: Negative.   Gastrointestinal: Negative.   Genitourinary: Negative.   Hematological: Negative.   Psychiatric/Behavioral: Negative.        Objective:   Physical Exam Vitals and nursing note reviewed.  Constitutional:      Appearance: Normal appearance.  HENT:     Head: Normocephalic and atraumatic.  Cardiovascular:     Rate and Rhythm: Normal rate.     Pulses: Normal pulses.  Pulmonary:     Effort: Pulmonary effort is normal.  Neurological:     General: No focal deficit present.     Mental Status: She is alert and oriented to person, place, and time.  Psychiatric:        Mood and Affect: Mood normal.        Behavior: Behavior normal.         Assessment & Plan:     ICD-10-CM   1. Capsular contracture of breast implant, initial encounter  T85.44XA   2. Breast asymmetry following reconstructive surgery  N65.1     Recommend contracture release of left breast with Lipo filling to prevent further contracture.  Pictures were obtained of the patient and placed in the chart with the patient's or guardian's permission.

## 2020-09-19 ENCOUNTER — Encounter: Payer: Medicare Other | Admitting: Physical Therapy

## 2020-09-25 ENCOUNTER — Encounter: Payer: Medicare Other | Admitting: Physical Therapy

## 2020-09-27 ENCOUNTER — Encounter: Payer: Medicare Other | Admitting: Physical Therapy

## 2020-10-02 ENCOUNTER — Encounter: Payer: Medicare Other | Admitting: Physical Therapy

## 2020-10-04 ENCOUNTER — Encounter: Payer: Medicare Other | Admitting: Physical Therapy

## 2020-10-06 MED FILL — AMLODIPINE BESYLATE 5 MG TA: 5 | 30 days supply | Qty: 30 | Fill #2

## 2020-10-09 ENCOUNTER — Encounter: Payer: Medicare Other | Admitting: Physical Therapy

## 2020-10-10 ENCOUNTER — Encounter: Payer: Self-pay | Admitting: Surgical

## 2020-10-10 ENCOUNTER — Ambulatory Visit (INDEPENDENT_AMBULATORY_CARE_PROVIDER_SITE_OTHER): Payer: Medicare Other | Admitting: Surgical

## 2020-10-10 ENCOUNTER — Other Ambulatory Visit: Payer: Self-pay

## 2020-10-10 ENCOUNTER — Other Ambulatory Visit: Payer: Self-pay | Admitting: Surgical

## 2020-10-10 VITALS — BP 145/87 | HR 76 | Ht 67.0 in | Wt 200.0 lb

## 2020-10-10 DIAGNOSIS — N651 Disproportion of reconstructed breast: Secondary | ICD-10-CM

## 2020-10-10 DIAGNOSIS — I871 Compression of vein: Secondary | ICD-10-CM

## 2020-10-10 DIAGNOSIS — Z9012 Acquired absence of left breast and nipple: Secondary | ICD-10-CM

## 2020-10-10 DIAGNOSIS — T8544XA Capsular contracture of breast implant, initial encounter: Secondary | ICD-10-CM

## 2020-10-10 MED ORDER — HYDROCODONE-ACETAMINOPHEN 5-325 MG PO TABS
1.0000 | ORAL_TABLET | Freq: Four times a day (QID) | ORAL | 0 refills | Status: DC | PRN
Start: 2020-10-10 — End: 2020-10-10

## 2020-10-10 MED ORDER — ONDANSETRON HCL 4 MG PO TABS: 4.0000 mg | ORAL_TABLET | Freq: Three times a day (TID) | ORAL | 0 refills | Status: DC | PRN

## 2020-10-10 MED ORDER — CEPHALEXIN 500 MG PO CAPS: 500.0000 mg | ORAL_CAPSULE | Freq: Four times a day (QID) | ORAL | 0 refills | Status: DC

## 2020-10-10 NOTE — Progress Notes (Signed)
Patient ID: Margaret Farmer, female    DOB: [birth year], 65 y.o.   MRN: [id number]  Chief Complaint  Patient presents with  . Pre-op Exam      ICD-10-CM   1. Capsular contracture of breast implant, initial encounter  T85.44XA   2. Breast asymmetry following reconstructive surgery  N65.1 US BREAST LTD UNI RIGHT INC AXILLA  3. May-Thurner syndrome  I87.1   4. Acquired absence of left breast  Z90.12     History of Present Illness: Margaret Farmer is a 65 y.o.  female  with a history of left mastectomy followed by placement of left breast tissue expander and subsequent left breast implant.  Patient also had a right breast implant placed for symmetry.  Presents for preoperative evaluation for upcoming procedure, release of left breast contracture and left breast fat filling, scheduled for 10/24/2020 with Dr. Marla Roe.  The patient has not had problems with anesthesia. No history of DVT/PE.  No family history of DVT/PE.  No family or personal history of bleeding or clotting disorders.  Patient is not currently taking any blood thinners.  No history of CVA/MI.   Summary of Previous Visit: Patient had a left mastectomy with expander placement in June 2021.  She then had exchange of her left breast expander for implant.  She had a implant placed in her right breast for symmetry.  She has had some contracture of the left breast that is creating significant wrinkling and rippling of the nipple areola.  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  ALPRAZolam (XANAX) 0.5 MG tablet, Take 0.5 mg by mouth 3 (three) times daily as needed for anxiety., Disp: , Rfl:  .  amLODipine (NORVASC) 5 MG tablet, Take 5 mg by mouth daily., Disp: , Rfl:  .  diazepam (VALIUM) 2 MG tablet, Take 1 tablet (2 mg  total) by mouth every 12 (twelve) hours as needed for muscle spasms., Disp: 20 tablet, Rfl: 0 .  enalapril (VASOTEC) 10 MG tablet, TAKE 1 TABLET BY MOUTH ONCE DAILY (Patient taking differently: Take 10 mg by mouth every evening.), Disp: 30 tablet, Rfl: 5 .  Eszopiclone 3 MG TABS, Take 3 mg by mouth See admin instructions. Every other night as needed for sleep (alternating between the Lunesta & Ambien), Disp: , Rfl: 5 .  fluticasone (FLONASE) 50 MCG/ACT nasal spray, Place 1-2 sprays into both nostrils 3 (three) times daily as needed., Disp: , Rfl:  .  lidocaine (LIDODERM) 5 %, Place 1 patch onto the skin daily as needed for pain., Disp: , Rfl:  .  lidocaine-prilocaine (EMLA) cream, Apply 1 application topically once. , Disp: , Rfl:  .  Liotrix 15 (3.1-12.5) MG (MCG) TABS, Take 1 capsule by mouth daily before breakfast. , Disp: , Rfl: 0 .  montelukast (SINGULAIR) 10 MG tablet, Take 10 mg by mouth at bedtime., Disp: , Rfl:  .  ondansetron (ZOFRAN) 4 MG tablet, Take 1 tablet (4 mg total) by mouth every 8 (eight) hours as needed for nausea or vomiting., Disp: 20 tablet, Rfl: 0 .  ondansetron (ZOFRAN) 4 MG tablet, Take 1 tablet (4 mg total) by mouth every 8 (eight) hours as needed for nausea or vomiting., Disp: 20 tablet, Rfl: 0 .  spironolactone (ALDACTONE) 25 MG tablet, Take 25 mg by mouth daily  as needed (leg swelling). , Disp: , Rfl: 12 .  SYNTHROID 137 MCG tablet, Take 137 mcg by mouth daily., Disp: , Rfl:  .  tiZANidine (ZANAFLEX) 4 MG tablet, Take 4 mg by mouth 3 (three) times daily as needed for spasms., Disp: , Rfl:  .  zolpidem (AMBIEN CR) 6.25 MG CR tablet, Take by mouth., Disp: , Rfl:   Past Medical Problems: Past Medical History:  Diagnosis Date  . Anxiety   . Depression   . GERD (gastroesophageal reflux disease)   . Hypertension   . Hypothyroid   . LBBB (left bundle branch block) >60yrs   workup was normal    Past Surgical History: Past Surgical History:  Procedure Laterality  Date  . BREAST BIOPSY Left 02/01/2020   affirm bx of calcs, coil marker, path pending lateral   . BREAST BIOPSY Left 02/01/2020   affirm bx of calcs, x clip, path pending lower outer  . BREAST RECONSTRUCTION WITH PLACEMENT OF TISSUE EXPANDER AND FLEX HD (ACELLULAR HYDRATED DERMIS) Left 03/14/2020   Procedure: BREAST RECONSTRUCTION WITH PLACEMENT OF TISSUE EXPANDER AND FLEX HD (ACELLULAR HYDRATED DERMIS);  Surgeon: Wallace Going, DO;  Location: [facility];  Service: Plastics;  Laterality: Left;  . COLONOSCOPY  2013  . MASTECTOMY W/ SENTINEL NODE BIOPSY Left 03/14/2020   Procedure: MASTECTOMY WITH SENTINEL LYMPH NODE BIOPSY;  Surgeon: Robert Bellow, MD;  Location: [facility];  Service: General;  Laterality: Left;  . NASAL SINUS SURGERY    . PLACEMENT OF BREAST IMPLANTS Right 05/24/2020   Procedure: PLACEMENT OF BREAST IMPLANT;  Surgeon: Wallace Going, DO;  Location: [facility];  Service: Plastics;  Laterality: Right;  . REMOVAL OF TISSUE EXPANDER AND PLACEMENT OF IMPLANT Left 05/24/2020   Procedure: REMOVAL OF TISSUE EXPANDER AND PLACEMENT OF IMPLANT;  Surgeon: Wallace Going, DO;  Location: [facility];  Service: Plastics;  Laterality: Left;  . SEPTOPLASTY    . VEIN SURGERY Left 1986    Social History: Social History   Socioeconomic History  . Marital status: Single    Spouse name: Not on file  . Number of children: Not on file  . Years of education: Not on file  . Highest education level: Not on file  Occupational History  . Not on file  Tobacco Use  . Smoking status: Never Smoker  . Smokeless tobacco: Never Used  Vaping Use  . Vaping Use: Never used  Substance and Sexual Activity  . Alcohol use: Yes    Comment: rare  . Drug use: No  . Sexual activity: Not on file  Other Topics Concern  . Not on file  Social History Narrative   retd [employer]; lives in [place]; no smoking/alcohol.    Social Determinants of Health   Financial  Resource Strain: Not on file  Food Insecurity: Not on file  Transportation Needs: Not on file  Physical Activity: Not on file  Stress: Not on file  Social Connections: Not on file  Intimate Partner Violence: Not on file    Family History: Family History  Problem Relation Age of Onset  . Kidney cancer Father 51  . Breast cancer Neg Hx     Review of Systems: Review of Systems  Constitutional: Negative.   Respiratory: Negative.   Cardiovascular: Positive for leg swelling (Chronic -left). Negative for chest pain.  Gastrointestinal: Negative.   Neurological: Negative.     Physical Exam: Vital Signs BP (!) 145/87 (BP Location: Left Arm, Patient  Position: Sitting, Cuff Size: Normal)   Pulse 76   Ht 5\' 7"  (1.702 m)   Wt 200 lb (90.7 kg)   SpO2 99%   BMI 31.32 kg/m  Physical Exam Exam conducted with a chaperone present.  Constitutional:      General: She is not in acute distress.    Appearance: Normal appearance. She is not ill-appearing.  HENT:     Head: Normocephalic and atraumatic.  Eyes:     Pupils: Pupils are equal, round Neck:     Musculoskeletal: Normal range of motion.  Cardiovascular:     Rate and Rhythm: Normal rate and regular rhythm.     Pulses: Normal pulses.     Heart sounds: Normal heart sounds. No murmur.  Pulmonary:     Effort: Pulmonary effort is normal. No respiratory distress.     Breath sounds: Normal breath sounds. No wheezing.  Breast: Left breast with scar contracture noted, NAC viable.  Right breast inframammary fold incision well-healed.  She does have some tenderness on the right superior lateral breast, no specific mass noted with palpation.  Right NAC viable with good color.  No nipple discharge noted from either breast.  No erythema noted. Abdominal:     General: Abdomen is flat. There is no distension.     Palpations: Abdomen is soft.     Tenderness: There is no abdominal tenderness.  Musculoskeletal: Normal range of motion.  Skin:     General: Skin is warm and dry.     Findings: No erythema or rash.  Neurological:     General: No focal deficit present.     Mental Status: She is alert and oriented to person, place, and time. Mental status is at baseline.     Motor: No weakness.  Psychiatric:        Mood and Affect: Mood normal.        Behavior: Behavior normal.     Assessment/Plan: The patient is scheduled for release of left breast scar contracture, liposuction of abdomen for Lipo filling of left breast with Dr. Marla Roe.  Risks, benefits, and alternatives of procedure discussed, questions answered and consent obtained.    Smoking Status: Non-smoker; Counseling Given?  N/A Last Mammogram: 01/11/2020; Results: Calcifications noted on left breast, underwent left mastectomy and reconstruction.  Caprini Score: 7; Risk Factors include: Age, BMI greater than 25, chronically swollen leg and length of planned surgery. Recommendation for mechanical and pharmacological prophylaxis. Encourage early ambulation.   Pictures obtained:@Previous  visit  Post-op Rx sent to pharmacy: Norco, Keflex, Zofran.  Patient was provided with the General Surgical Risk consent document and Pain Medication Agreement prior to their appointment.  They had adequate time to read through the risk consent documents and Pain Medication Agreement. We also discussed them in person together during this preop appointment. All of their questions were answered to their satisfaction.  Recommended calling if they have any further questions.  Risk consent form and Pain Medication Agreement to be scanned into patient's chart.  The risks that can be encountered with and after liposuction were discussed and include the following but no limited to these:  Asymmetry, fluid accumulation, firmness of the area, fat necrosis with death of fat tissue, bleeding, infection, delayed healing, anesthesia risks, skin sensation changes, injury to structures including nerves, blood  vessels, and muscles which may be temporary or permanent, allergies to tape, suture materials and glues, blood products, topical preparations or injected agents, skin and contour irregularities, skin discoloration and swelling, deep vein  thrombosis, cardiac and pulmonary complications, pain, which may persist, persistent pain, recurrence of the lesion, poor healing of the incision, possible need for revisional surgery or staged procedures. Thiere can also be persistent swelling, poor wound healing, rippling or loose skin, worsening of cellulite, swelling, and thermal burn or heat injury from ultrasound with the ultrasound-assisted lipoplasty technique. Any change in weight fluctuations can alter the outcome.  The risks that can be encountered with and after placement of a breast implant were discussed and include the following but not limited to these: bleeding, infection, delayed healing, anesthesia risks, skin sensation changes, injury to structures including nerves, blood vessels, and muscles which may be temporary or permanent, allergies to tape, suture materials and glues, blood products, topical preparations or injected agents, skin contour irregularities, skin discoloration and swelling, deep vein thrombosis, cardiac and pulmonary complications, pain, which may persist, fluid accumulation, wrinkling of the skin over the implanmt, changes in nipple or breast sensation, implant leakage or rupture, faulty position of the implant, persistent pain, formation of tight scar tissue around the implant (capsular contracture).  Patient having pain in her right breast, will send for ultrasound to evaluate area of concern prior to surgery.   Electronically signed by: Carola Rhine Deania Siguenza, PA-C 10/10/2020 10:41 AM

## 2020-10-10 NOTE — H&P (View-Only) (Signed)
Patient ID: Margaret Farmer, female    DOB: [birth year], 65 y.o.   MRN: [id number]  Chief Complaint  Patient presents with  . Pre-op Exam      ICD-10-CM   1. Capsular contracture of breast implant, initial encounter  T85.44XA   2. Breast asymmetry following reconstructive surgery  N65.1 US BREAST LTD UNI RIGHT INC AXILLA  3. May-Thurner syndrome  I87.1   4. Acquired absence of left breast  Z90.12     History of Present Illness: Margaret Farmer is a 65 y.o.  female  with a history of left mastectomy followed by placement of left breast tissue expander and subsequent left breast implant.  Patient also had a right breast implant placed for symmetry.  Presents for preoperative evaluation for upcoming procedure, release of left breast contracture and left breast fat filling, scheduled for 10/24/2020 with Dr. Marla Roe.  The patient has not had problems with anesthesia. No history of DVT/PE.  No family history of DVT/PE.  No family or personal history of bleeding or clotting disorders.  Patient is not currently taking any blood thinners.  No history of CVA/MI.   Summary of Previous Visit: Patient had a left mastectomy with expander placement in June 2021.  She then had exchange of her left breast expander for implant.  She had a implant placed in her right breast for symmetry.  She has had some contracture of the left breast that is creating significant wrinkling and rippling of the nipple areola.  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  ALPRAZolam (XANAX) 0.5 MG tablet, Take 0.5 mg by mouth 3 (three) times daily as needed for anxiety., Disp: , Rfl:  .  amLODipine (NORVASC) 5 MG tablet, Take 5 mg by mouth daily., Disp: , Rfl:  .  diazepam (VALIUM) 2 MG tablet, Take 1 tablet (2 mg  total) by mouth every 12 (twelve) hours as needed for muscle spasms., Disp: 20 tablet, Rfl: 0 .  enalapril (VASOTEC) 10 MG tablet, TAKE 1 TABLET BY MOUTH ONCE DAILY (Patient taking differently: Take 10 mg by mouth every evening.), Disp: 30 tablet, Rfl: 5 .  Eszopiclone 3 MG TABS, Take 3 mg by mouth See admin instructions. Every other night as needed for sleep (alternating between the Lunesta & Ambien), Disp: , Rfl: 5 .  fluticasone (FLONASE) 50 MCG/ACT nasal spray, Place 1-2 sprays into both nostrils 3 (three) times daily as needed., Disp: , Rfl:  .  lidocaine (LIDODERM) 5 %, Place 1 patch onto the skin daily as needed for pain., Disp: , Rfl:  .  lidocaine-prilocaine (EMLA) cream, Apply 1 application topically once. , Disp: , Rfl:  .  Liotrix 15 (3.1-12.5) MG (MCG) TABS, Take 1 capsule by mouth daily before breakfast. , Disp: , Rfl: 0 .  montelukast (SINGULAIR) 10 MG tablet, Take 10 mg by mouth at bedtime., Disp: , Rfl:  .  ondansetron (ZOFRAN) 4 MG tablet, Take 1 tablet (4 mg total) by mouth every 8 (eight) hours as needed for nausea or vomiting., Disp: 20 tablet, Rfl: 0 .  ondansetron (ZOFRAN) 4 MG tablet, Take 1 tablet (4 mg total) by mouth every 8 (eight) hours as needed for nausea or vomiting., Disp: 20 tablet, Rfl: 0 .  spironolactone (ALDACTONE) 25 MG tablet, Take 25 mg by mouth daily  as needed (leg swelling). , Disp: , Rfl: 12 .  SYNTHROID 137 MCG tablet, Take 137 mcg by mouth daily., Disp: , Rfl:  .  tiZANidine (ZANAFLEX) 4 MG tablet, Take 4 mg by mouth 3 (three) times daily as needed for spasms., Disp: , Rfl:  .  zolpidem (AMBIEN CR) 6.25 MG CR tablet, Take by mouth., Disp: , Rfl:   Past Medical Problems: Past Medical History:  Diagnosis Date  . Anxiety   . Depression   . GERD (gastroesophageal reflux disease)   . Hypertension   . Hypothyroid   . LBBB (left bundle branch block) >60yrs   workup was normal    Past Surgical History: Past Surgical History:  Procedure Laterality  Date  . BREAST BIOPSY Left 02/01/2020   affirm bx of calcs, coil marker, path pending lateral   . BREAST BIOPSY Left 02/01/2020   affirm bx of calcs, x clip, path pending lower outer  . BREAST RECONSTRUCTION WITH PLACEMENT OF TISSUE EXPANDER AND FLEX HD (ACELLULAR HYDRATED DERMIS) Left 03/14/2020   Procedure: BREAST RECONSTRUCTION WITH PLACEMENT OF TISSUE EXPANDER AND FLEX HD (ACELLULAR HYDRATED DERMIS);  Surgeon: Wallace Going, DO;  Location: [facility];  Service: Plastics;  Laterality: Left;  . COLONOSCOPY  2013  . MASTECTOMY W/ SENTINEL NODE BIOPSY Left 03/14/2020   Procedure: MASTECTOMY WITH SENTINEL LYMPH NODE BIOPSY;  Surgeon: Robert Bellow, MD;  Location: [facility];  Service: General;  Laterality: Left;  . NASAL SINUS SURGERY    . PLACEMENT OF BREAST IMPLANTS Right 05/24/2020   Procedure: PLACEMENT OF BREAST IMPLANT;  Surgeon: Wallace Going, DO;  Location: [facility];  Service: Plastics;  Laterality: Right;  . REMOVAL OF TISSUE EXPANDER AND PLACEMENT OF IMPLANT Left 05/24/2020   Procedure: REMOVAL OF TISSUE EXPANDER AND PLACEMENT OF IMPLANT;  Surgeon: Wallace Going, DO;  Location: [facility];  Service: Plastics;  Laterality: Left;  . SEPTOPLASTY    . VEIN SURGERY Left 1986    Social History: Social History   Socioeconomic History  . Marital status: Single    Spouse name: Not on file  . Number of children: Not on file  . Years of education: Not on file  . Highest education level: Not on file  Occupational History  . Not on file  Tobacco Use  . Smoking status: Never Smoker  . Smokeless tobacco: Never Used  Vaping Use  . Vaping Use: Never used  Substance and Sexual Activity  . Alcohol use: Yes    Comment: rare  . Drug use: No  . Sexual activity: Not on file  Other Topics Concern  . Not on file  Social History Narrative   retd [employer]; lives in [place]; no smoking/alcohol.    Social Determinants of Health   Financial  Resource Strain: Not on file  Food Insecurity: Not on file  Transportation Needs: Not on file  Physical Activity: Not on file  Stress: Not on file  Social Connections: Not on file  Intimate Partner Violence: Not on file    Family History: Family History  Problem Relation Age of Onset  . Kidney cancer Father 51  . Breast cancer Neg Hx     Review of Systems: Review of Systems  Constitutional: Negative.   Respiratory: Negative.   Cardiovascular: Positive for leg swelling (Chronic -left). Negative for chest pain.  Gastrointestinal: Negative.   Neurological: Negative.     Physical Exam: Vital Signs BP (!) 145/87 (BP Location: Left Arm, Patient  Position: Sitting, Cuff Size: Normal)   Pulse 76   Ht 5\' 7"  (1.702 m)   Wt 200 lb (90.7 kg)   SpO2 99%   BMI 31.32 kg/m  Physical Exam Exam conducted with a chaperone present.  Constitutional:      General: She is not in acute distress.    Appearance: Normal appearance. She is not ill-appearing.  HENT:     Head: Normocephalic and atraumatic.  Eyes:     Pupils: Pupils are equal, round Neck:     Musculoskeletal: Normal range of motion.  Cardiovascular:     Rate and Rhythm: Normal rate and regular rhythm.     Pulses: Normal pulses.     Heart sounds: Normal heart sounds. No murmur.  Pulmonary:     Effort: Pulmonary effort is normal. No respiratory distress.     Breath sounds: Normal breath sounds. No wheezing.  Breast: Left breast with scar contracture noted, NAC viable.  Right breast inframammary fold incision well-healed.  She does have some tenderness on the right superior lateral breast, no specific mass noted with palpation.  Right NAC viable with good color.  No nipple discharge noted from either breast.  No erythema noted. Abdominal:     General: Abdomen is flat. There is no distension.     Palpations: Abdomen is soft.     Tenderness: There is no abdominal tenderness.  Musculoskeletal: Normal range of motion.  Skin:     General: Skin is warm and dry.     Findings: No erythema or rash.  Neurological:     General: No focal deficit present.     Mental Status: She is alert and oriented to person, place, and time. Mental status is at baseline.     Motor: No weakness.  Psychiatric:        Mood and Affect: Mood normal.        Behavior: Behavior normal.     Assessment/Plan: The patient is scheduled for release of left breast scar contracture, liposuction of abdomen for Lipo filling of left breast with Dr. Marla Roe.  Risks, benefits, and alternatives of procedure discussed, questions answered and consent obtained.    Smoking Status: Non-smoker; Counseling Given?  N/A Last Mammogram: 01/11/2020; Results: Calcifications noted on left breast, underwent left mastectomy and reconstruction.  Caprini Score: 7; Risk Factors include: Age, BMI greater than 25, chronically swollen leg and length of planned surgery. Recommendation for mechanical and pharmacological prophylaxis. Encourage early ambulation.   Pictures obtained:@Previous  visit  Post-op Rx sent to pharmacy: Norco, Keflex, Zofran.  Patient was provided with the General Surgical Risk consent document and Pain Medication Agreement prior to their appointment.  They had adequate time to read through the risk consent documents and Pain Medication Agreement. We also discussed them in person together during this preop appointment. All of their questions were answered to their satisfaction.  Recommended calling if they have any further questions.  Risk consent form and Pain Medication Agreement to be scanned into patient's chart.  The risks that can be encountered with and after liposuction were discussed and include the following but no limited to these:  Asymmetry, fluid accumulation, firmness of the area, fat necrosis with death of fat tissue, bleeding, infection, delayed healing, anesthesia risks, skin sensation changes, injury to structures including nerves, blood  vessels, and muscles which may be temporary or permanent, allergies to tape, suture materials and glues, blood products, topical preparations or injected agents, skin and contour irregularities, skin discoloration and swelling, deep vein  thrombosis, cardiac and pulmonary complications, pain, which may persist, persistent pain, recurrence of the lesion, poor healing of the incision, possible need for revisional surgery or staged procedures. Thiere can also be persistent swelling, poor wound healing, rippling or loose skin, worsening of cellulite, swelling, and thermal burn or heat injury from ultrasound with the ultrasound-assisted lipoplasty technique. Any change in weight fluctuations can alter the outcome.  The risks that can be encountered with and after placement of a breast implant were discussed and include the following but not limited to these: bleeding, infection, delayed healing, anesthesia risks, skin sensation changes, injury to structures including nerves, blood vessels, and muscles which may be temporary or permanent, allergies to tape, suture materials and glues, blood products, topical preparations or injected agents, skin contour irregularities, skin discoloration and swelling, deep vein thrombosis, cardiac and pulmonary complications, pain, which may persist, fluid accumulation, wrinkling of the skin over the implanmt, changes in nipple or breast sensation, implant leakage or rupture, faulty position of the implant, persistent pain, formation of tight scar tissue around the implant (capsular contracture).  Patient having pain in her right breast, will send for ultrasound to evaluate area of concern prior to surgery.   Electronically signed by: Carola Rhine Hayleigh Bawa, PA-C 10/10/2020 10:41 AM

## 2020-10-11 ENCOUNTER — Encounter: Payer: Medicare Other | Admitting: Physical Therapy

## 2020-10-11 MED FILL — METOPROLOL TARTRATE 25 MG T: 25 | 30 days supply | Qty: 60 | Fill #4

## 2020-10-12 MED FILL — ENALAPRIL MALEATE 10 MG TAB: 10 | 90 days supply | Qty: 90 | Fill #0 | Status: TO

## 2020-10-17 ENCOUNTER — Ambulatory Visit
Admission: RE | Admit: 2020-10-17 | Discharge: 2020-10-17 | Disposition: A | Discharge status: Home / Self Care | Payer: Medicare Other | Source: Ambulatory Visit | Attending: Surgical | Admitting: Surgical

## 2020-10-17 ENCOUNTER — Encounter (HOSPITAL_BASED_OUTPATIENT_CLINIC_OR_DEPARTMENT_OTHER): Payer: Self-pay | Admitting: Plastic Surgery

## 2020-10-17 ENCOUNTER — Other Ambulatory Visit: Payer: Self-pay

## 2020-10-17 ENCOUNTER — Other Ambulatory Visit: Payer: Self-pay | Admitting: Surgical

## 2020-10-17 DIAGNOSIS — N651 Disproportion of reconstructed breast: Secondary | ICD-10-CM

## 2020-10-17 HISTORY — DX: Malignant neoplasm of unspecified site of unspecified female breast: C50.919

## 2020-10-20 ENCOUNTER — Inpatient Hospital Stay (HOSPITAL_COMMUNITY): Admission: RE | Admit: 2020-10-20 | Payer: Medicare Other | Source: Ambulatory Visit

## 2020-10-22 ENCOUNTER — Other Ambulatory Visit (HOSPITAL_COMMUNITY)
Admission: RE | Admit: 2020-10-22 | Discharge: 2020-10-22 | Disposition: A | Discharge status: Home / Self Care | Payer: Medicare Other | Source: Ambulatory Visit | Attending: Plastic Surgery | Admitting: Plastic Surgery

## 2020-10-22 ENCOUNTER — Encounter (HOSPITAL_BASED_OUTPATIENT_CLINIC_OR_DEPARTMENT_OTHER)
Admission: RE | Admit: 2020-10-22 | Discharge: 2020-10-22 | Disposition: A | Discharge status: Home / Self Care | Payer: Medicare Other | Source: Ambulatory Visit | Attending: Plastic Surgery | Admitting: Plastic Surgery

## 2020-10-22 DIAGNOSIS — Z20822 Contact with and (suspected) exposure to covid-19: Secondary | ICD-10-CM | POA: Diagnosis not present

## 2020-10-22 DIAGNOSIS — Z01812 Encounter for preprocedural laboratory examination: Secondary | ICD-10-CM | POA: Insufficient documentation

## 2020-10-22 LAB — BASIC METABOLIC PANEL
Anion gap: 10 (ref 5–15)
BUN: 13 mg/dL (ref 8–23)
CO2: 24 mmol/L (ref 22–32)
Calcium: 9.8 mg/dL (ref 8.9–10.3)
Chloride: 106 mmol/L (ref 98–111)
Creatinine, Ser: 0.69 mg/dL (ref 0.44–1.00)
GFR, Estimated: >60 mL/min (ref >60)
Glucose, Bld: 95 mg/dL (ref 70–99)
Potassium: 5 mmol/L (ref 3.5–5.1)
Sodium: 140 mmol/L (ref 135–145)

## 2020-10-23 LAB — SARS CORONAVIRUS 2 (TAT 6-24 HRS): SARS Coronavirus 2: NEGATIVE

## 2020-10-24 ENCOUNTER — Ambulatory Visit (HOSPITAL_BASED_OUTPATIENT_CLINIC_OR_DEPARTMENT_OTHER): Payer: Medicare Other | Admitting: Anesthesiology

## 2020-10-24 ENCOUNTER — Ambulatory Visit (HOSPITAL_BASED_OUTPATIENT_CLINIC_OR_DEPARTMENT_OTHER)
Admission: RE | Admit: 2020-10-24 | Discharge: 2020-10-24 | Disposition: A | Discharge status: Home / Self Care | Payer: Medicare Other | Attending: Plastic Surgery | Admitting: Plastic Surgery

## 2020-10-24 ENCOUNTER — Encounter (HOSPITAL_BASED_OUTPATIENT_CLINIC_OR_DEPARTMENT_OTHER)
Admission: RE | Disposition: A | Discharge status: Home / Self Care | Payer: Self-pay | Source: Home / Self Care | Attending: Plastic Surgery

## 2020-10-24 ENCOUNTER — Other Ambulatory Visit: Payer: Self-pay

## 2020-10-24 ENCOUNTER — Encounter (HOSPITAL_BASED_OUTPATIENT_CLINIC_OR_DEPARTMENT_OTHER): Payer: Self-pay | Admitting: Plastic Surgery

## 2020-10-24 DIAGNOSIS — Z9012 Acquired absence of left breast and nipple: Secondary | ICD-10-CM | POA: Insufficient documentation

## 2020-10-24 DIAGNOSIS — N651 Disproportion of reconstructed breast: Secondary | ICD-10-CM | POA: Diagnosis not present

## 2020-10-24 DIAGNOSIS — T8544XA Capsular contracture of breast implant, initial encounter: Secondary | ICD-10-CM | POA: Insufficient documentation

## 2020-10-24 DIAGNOSIS — Y818 Miscellaneous general- and plastic-surgery devices associated with adverse incidents, not elsewhere classified: Secondary | ICD-10-CM | POA: Insufficient documentation

## 2020-10-24 DIAGNOSIS — N6489 Other specified disorders of breast: Secondary | ICD-10-CM | POA: Diagnosis not present

## 2020-10-24 HISTORY — PX: SCAR REVISION: SHX5285

## 2020-10-24 HISTORY — PX: LIPOSUCTION WITH LIPOFILLING: SHX6436

## 2020-10-24 SURGERY — REVISION, SCAR
Anesthesia: General | Site: Breast | Laterality: Left

## 2020-10-24 MED ORDER — LIDOCAINE 2% (20 MG/ML) 5 ML SYRINGE
INTRAMUSCULAR | Status: AC
  Filled 2020-10-24: qty 5

## 2020-10-24 MED ORDER — ACETAMINOPHEN 325 MG RE SUPP: 650.0000 mg | RECTAL | Status: DC | PRN

## 2020-10-24 MED ORDER — EPHEDRINE 5 MG/ML INJ
INTRAVENOUS | Status: AC
  Filled 2020-10-24: qty 10

## 2020-10-24 MED ORDER — CEFAZOLIN SODIUM-DEXTROSE 2-4 GM/100ML-% IV SOLN
2.0000 g | INTRAVENOUS | Status: AC
  Administered 2020-10-24 (×2): 2 g via INTRAVENOUS

## 2020-10-24 MED ORDER — CHLORHEXIDINE GLUCONATE CLOTH 2 % EX PADS: 6.0000 | MEDICATED_PAD | Freq: Once | CUTANEOUS | Status: DC

## 2020-10-24 MED ORDER — EPHEDRINE SULFATE 50 MG/ML IJ SOLN
INTRAMUSCULAR | Status: DC | PRN
  Administered 2020-10-24: 15 mg via INTRAVENOUS
  Administered 2020-10-24: 10 mg via INTRAVENOUS
  Administered 2020-10-24: 15 mg via INTRAVENOUS

## 2020-10-24 MED ORDER — PHENYLEPHRINE 40 MCG/ML (10ML) SYRINGE FOR IV PUSH (FOR BLOOD PRESSURE SUPPORT)
PREFILLED_SYRINGE | INTRAVENOUS | Status: AC
  Filled 2020-10-24: qty 10

## 2020-10-24 MED ORDER — DIPHENHYDRAMINE HCL 50 MG/ML IJ SOLN
INTRAMUSCULAR | Status: AC
  Filled 2020-10-24: qty 1

## 2020-10-24 MED ORDER — SODIUM CHLORIDE 0.9% FLUSH: 3.0000 mL | INTRAVENOUS | Status: DC | PRN

## 2020-10-24 MED ORDER — DEXAMETHASONE SODIUM PHOSPHATE 10 MG/ML IJ SOLN
INTRAMUSCULAR | Status: AC
  Filled 2020-10-24: qty 1

## 2020-10-24 MED ORDER — ACETAMINOPHEN 325 MG PO TABS: 650.0000 mg | ORAL_TABLET | ORAL | Status: DC | PRN

## 2020-10-24 MED ORDER — FENTANYL CITRATE (PF) 100 MCG/2ML IJ SOLN
INTRAMUSCULAR | Status: AC
  Filled 2020-10-24: qty 2

## 2020-10-24 MED ORDER — OXYCODONE HCL 5 MG PO TABS: 5.0000 mg | ORAL_TABLET | ORAL | Status: DC | PRN

## 2020-10-24 MED ORDER — PROPOFOL 10 MG/ML IV BOLUS
INTRAVENOUS | Status: DC | PRN
  Administered 2020-10-24: 150 mg via INTRAVENOUS

## 2020-10-24 MED ORDER — LIDOCAINE HCL (CARDIAC) PF 100 MG/5ML IV SOSY
PREFILLED_SYRINGE | INTRAVENOUS | Status: DC | PRN
  Administered 2020-10-24: 50 mg via INTRAVENOUS

## 2020-10-24 MED ORDER — FENTANYL CITRATE (PF) 100 MCG/2ML IJ SOLN: 25.0000 ug | INTRAMUSCULAR | Status: DC | PRN

## 2020-10-24 MED ORDER — OXYCODONE HCL 5 MG PO TABS
5.0000 mg | ORAL_TABLET | Freq: Once | ORAL | Status: AC | PRN
  Administered 2020-10-24: 5 mg via ORAL

## 2020-10-24 MED ORDER — OXYCODONE HCL 5 MG PO TABS
ORAL_TABLET | ORAL | Status: AC
  Filled 2020-10-24: qty 1

## 2020-10-24 MED ORDER — ONDANSETRON HCL 4 MG/2ML IJ SOLN
4.0000 mg | Freq: Once | INTRAMUSCULAR | Status: AC | PRN
  Administered 2020-10-24: 4 mg via INTRAVENOUS

## 2020-10-24 MED ORDER — LACTATED RINGERS IV SOLN: INTRAVENOUS | Status: DC

## 2020-10-24 MED ORDER — DIPHENHYDRAMINE HCL 50 MG/ML IJ SOLN
INTRAMUSCULAR | Status: DC | PRN
  Administered 2020-10-24: 12.5 mg via INTRAVENOUS

## 2020-10-24 MED ORDER — SUCCINYLCHOLINE CHLORIDE 200 MG/10ML IV SOSY
PREFILLED_SYRINGE | INTRAVENOUS | Status: AC
  Filled 2020-10-24: qty 10

## 2020-10-24 MED ORDER — SODIUM CHLORIDE 0.9% FLUSH: 3.0000 mL | Freq: Two times a day (BID) | INTRAVENOUS | Status: DC

## 2020-10-24 MED ORDER — OXYCODONE HCL 5 MG/5ML PO SOLN: 5.0000 mg | Freq: Once | ORAL | Status: AC | PRN

## 2020-10-24 MED ORDER — FENTANYL CITRATE (PF) 100 MCG/2ML IJ SOLN
INTRAMUSCULAR | Status: DC | PRN
  Administered 2020-10-24: 50 ug via INTRAVENOUS
  Administered 2020-10-24: 100 ug via INTRAVENOUS

## 2020-10-24 MED ORDER — FENTANYL CITRATE (PF) 100 MCG/2ML IJ SOLN
25.0000 ug | INTRAMUSCULAR | Status: DC | PRN
  Administered 2020-10-24: 50 ug via INTRAVENOUS

## 2020-10-24 MED ORDER — LIDOCAINE HCL 1 % IJ SOLN
INTRAVENOUS | Status: DC | PRN
  Administered 2020-10-24: 1000 mL

## 2020-10-24 MED ORDER — DEXAMETHASONE SODIUM PHOSPHATE 4 MG/ML IJ SOLN
INTRAMUSCULAR | Status: DC | PRN
  Administered 2020-10-24: 10 mg via INTRAVENOUS

## 2020-10-24 MED ORDER — MIDAZOLAM HCL 2 MG/2ML IJ SOLN
INTRAMUSCULAR | Status: AC
  Filled 2020-10-24: qty 2

## 2020-10-24 MED ORDER — MIDAZOLAM HCL 5 MG/5ML IJ SOLN
INTRAMUSCULAR | Status: DC | PRN
  Administered 2020-10-24: 2 mg via INTRAVENOUS

## 2020-10-24 MED ORDER — LIDOCAINE-EPINEPHRINE 1 %-1:100000 IJ SOLN
INTRAMUSCULAR | Status: DC | PRN
  Administered 2020-10-24: 17 mL

## 2020-10-24 MED ORDER — ONDANSETRON HCL 4 MG/2ML IJ SOLN
INTRAMUSCULAR | Status: AC
  Filled 2020-10-24: qty 2

## 2020-10-24 MED ORDER — PHENYLEPHRINE HCL (PRESSORS) 10 MG/ML IV SOLN
INTRAVENOUS | Status: DC | PRN
  Administered 2020-10-24: 80 ug via INTRAVENOUS

## 2020-10-24 MED ORDER — CEFAZOLIN SODIUM-DEXTROSE 2-4 GM/100ML-% IV SOLN
INTRAVENOUS | Status: AC
  Filled 2020-10-24: qty 100

## 2020-10-24 MED ORDER — SODIUM CHLORIDE 0.9 % IV SOLN
250.0000 mL | INTRAVENOUS | Status: DC | PRN
Start: 2020-10-24 — End: 2020-10-24

## 2020-10-24 MED ORDER — PROPOFOL 500 MG/50ML IV EMUL
INTRAVENOUS | Status: AC
  Filled 2020-10-24: qty 50

## 2020-10-24 SURGICAL SUPPLY — 87 items
ADH SKN CLS APL DERMABOND .7 (GAUZE/BANDAGES/DRESSINGS) ×1
BINDER ABDOMINAL  9 SM 30-45 (SOFTGOODS)
BINDER ABDOMINAL 10 UNV 27-48 (MISCELLANEOUS) IMPLANT
BINDER ABDOMINAL 12 SM 30-45 (SOFTGOODS) ×2 IMPLANT
BINDER ABDOMINAL 9 SM 30-45 (SOFTGOODS) IMPLANT
BINDER BREAST LRG (GAUZE/BANDAGES/DRESSINGS) IMPLANT
BINDER BREAST MEDIUM (GAUZE/BANDAGES/DRESSINGS) IMPLANT
BINDER BREAST XLRG (GAUZE/BANDAGES/DRESSINGS) IMPLANT
BINDER BREAST XXLRG (GAUZE/BANDAGES/DRESSINGS) ×2 IMPLANT
BLADE CLIPPER SURG (BLADE) IMPLANT
BLADE HEX COATED 2.75 (ELECTRODE) IMPLANT
BLADE SURG 15 STRL LF DISP TIS (BLADE) ×2 IMPLANT
BLADE SURG 15 STRL SS (BLADE) ×4
BNDG CONFORM 2 STRL LF (GAUZE/BANDAGES/DRESSINGS) IMPLANT
BNDG ELASTIC 2X5.8 VLCR STR LF (GAUZE/BANDAGES/DRESSINGS) IMPLANT
CANISTER SUCT 1200ML W/VALVE (MISCELLANEOUS) IMPLANT
CLSR STERI-STRIP ANTIMIC 1/2X4 (GAUZE/BANDAGES/DRESSINGS) ×2 IMPLANT
CORD BIPOLAR FORCEPS 12FT (ELECTRODE) IMPLANT
COVER BACK TABLE 60X90IN (DRAPES) ×2 IMPLANT
COVER MAYO STAND STRL (DRAPES) ×2 IMPLANT
COVER WAND RF STERILE (DRAPES) IMPLANT
DECANTER SPIKE VIAL GLASS SM (MISCELLANEOUS) IMPLANT
DERMABOND ADVANCED (GAUZE/BANDAGES/DRESSINGS) ×1
DERMABOND ADVANCED .7 DNX12 (GAUZE/BANDAGES/DRESSINGS) ×1 IMPLANT
DRAPE LAPAROSCOPIC ABDOMINAL (DRAPES) ×2 IMPLANT
DRAPE LAPAROTOMY 100X72 PEDS (DRAPES) IMPLANT
DRAPE U-SHAPE 76X120 STRL (DRAPES) IMPLANT
DRSG PAD ABDOMINAL 8X10 ST (GAUZE/BANDAGES/DRESSINGS) ×4 IMPLANT
ELECT COATED BLADE 2.86 ST (ELECTRODE) IMPLANT
ELECT NEEDLE BLADE 2-5/6 (NEEDLE) ×2 IMPLANT
ELECT REM PT RETURN 9FT ADLT (ELECTROSURGICAL) ×2
ELECT REM PT RETURN 9FT PED (ELECTROSURGICAL)
ELECTRODE REM PT RETRN 9FT PED (ELECTROSURGICAL) IMPLANT
ELECTRODE REM PT RTRN 9FT ADLT (ELECTROSURGICAL) ×1 IMPLANT
EXTRACTOR CANIST REVOLVE STRL (CANNISTER) ×2 IMPLANT
GAUZE SPONGE 4X4 12PLY STRL LF (GAUZE/BANDAGES/DRESSINGS) ×4 IMPLANT
GAUZE XEROFORM 1X8 LF (GAUZE/BANDAGES/DRESSINGS) IMPLANT
GLOVE SURG ENC MOIS LTX SZ6.5 (GLOVE) ×8 IMPLANT
GLOVE SURG UNDER POLY LF SZ7 (GLOVE) ×4 IMPLANT
GOWN STRL REUS W/ TWL LRG LVL3 (GOWN DISPOSABLE) ×2 IMPLANT
GOWN STRL REUS W/TWL 2XL LVL3 (GOWN DISPOSABLE) ×6 IMPLANT
GOWN STRL REUS W/TWL LRG LVL3 (GOWN DISPOSABLE) ×4
IV LACTATED RINGERS 1000ML (IV SOLUTION) ×4 IMPLANT
LINER CANISTER 1000CC FLEX (MISCELLANEOUS) ×4 IMPLANT
NDL SAFETY ECLIPSE 18X1.5 (NEEDLE) ×1 IMPLANT
NEEDLE HYPO 18GX1.5 SHARP (NEEDLE) ×2
NEEDLE HYPO 25X1 1.5 SAFETY (NEEDLE) ×2 IMPLANT
NEEDLE HYPO 30GX1 BEV (NEEDLE) IMPLANT
NEEDLE PRECISIONGLIDE 27X1.5 (NEEDLE) IMPLANT
NS IRRIG 1000ML POUR BTL (IV SOLUTION) ×2 IMPLANT
PACK BASIN DAY SURGERY FS (CUSTOM PROCEDURE TRAY) ×2 IMPLANT
PAD ALCOHOL SWAB (MISCELLANEOUS) ×2 IMPLANT
PENCIL SMOKE EVACUATOR (MISCELLANEOUS) ×2 IMPLANT
SHEET MEDIUM DRAPE 40X70 STRL (DRAPES) IMPLANT
SLEEVE SCD COMPRESS KNEE MED (MISCELLANEOUS) ×2 IMPLANT
SPONGE GAUZE 2X2 8PLY STRL LF (GAUZE/BANDAGES/DRESSINGS) IMPLANT
SPONGE LAP 18X18 RF (DISPOSABLE) ×2 IMPLANT
STRIP CLOSURE SKIN 1/2X4 (GAUZE/BANDAGES/DRESSINGS) IMPLANT
STRIP SUTURE WOUND CLOSURE 1/2 (MISCELLANEOUS) IMPLANT
SUCTION FRAZIER HANDLE 10FR (MISCELLANEOUS)
SUCTION TUBE FRAZIER 10FR DISP (MISCELLANEOUS) IMPLANT
SUT CHROMIC 4 0 P 3 18 (SUTURE) IMPLANT
SUT ETHILON 4 0 PS 2 18 (SUTURE) IMPLANT
SUT ETHILON 5 0 P 3 18 (SUTURE)
SUT MNCRL 6-0 UNDY P1 1X18 (SUTURE) IMPLANT
SUT MNCRL AB 4-0 PS2 18 (SUTURE) IMPLANT
SUT MON AB 5-0 P3 18 (SUTURE) IMPLANT
SUT MON AB 5-0 PS2 18 (SUTURE) ×4 IMPLANT
SUT MONOCRYL 6-0 P1 1X18 (SUTURE)
SUT NYLON ETHILON 5-0 P-3 1X18 (SUTURE) IMPLANT
SUT PLAIN 5 0 P 3 18 (SUTURE) IMPLANT
SUT VIC AB 5-0 P-3 18X BRD (SUTURE) IMPLANT
SUT VIC AB 5-0 P3 18 (SUTURE)
SUT VICRYL 4-0 PS2 18IN ABS (SUTURE) IMPLANT
SUT VICRYL 6 0 P 1 18 (SUTURE) IMPLANT
SYR 10ML LL (SYRINGE) ×10 IMPLANT
SYR 3ML 18GX1 1/2 (SYRINGE) IMPLANT
SYR 50ML LL SCALE MARK (SYRINGE) ×4 IMPLANT
SYR BULB EAR ULCER 3OZ GRN STR (SYRINGE) IMPLANT
SYR CONTROL 10ML LL (SYRINGE) ×2 IMPLANT
SYR TOOMEY 50ML (SYRINGE) IMPLANT
TOWEL GREEN STERILE FF (TOWEL DISPOSABLE) ×4 IMPLANT
TRAY DSU PREP LF (CUSTOM PROCEDURE TRAY) ×2 IMPLANT
TUBE CONNECTING 20X1/4 (TUBING) ×2 IMPLANT
TUBING INFILTRATION IT-10001 (TUBING) ×2 IMPLANT
TUBING SET GRADUATE ASPIR 12FT (MISCELLANEOUS) ×2 IMPLANT
UNDERPAD 30X36 HEAVY ABSORB (UNDERPADS AND DIAPERS) ×4 IMPLANT

## 2020-10-24 NOTE — Anesthesia Procedure Notes (Signed)
Performed by: Willa Frater, CRNA

## 2020-10-24 NOTE — Interval H&P Note (Signed)
History and Physical Interval Note:  10/24/2020 11:00 AM  Margaret Farmer  has presented today for surgery, with the diagnosis of history of breast cancer.  The various methods of treatment have been discussed with the patient and family. After consideration of risks, benefits and other options for treatment, the patient has consented to  Procedure(s) with comments: Release of left breast contracture (Left) - 90 min, please with fat filling (Left) as a surgical intervention.  The patient's history has been reviewed, patient examined, no change in status, stable for surgery.  I have reviewed the patient's chart and labs.  Questions were answered to the patient's satisfaction.     Loel Lofty Daivik Overley

## 2020-10-24 NOTE — Anesthesia Postprocedure Evaluation (Signed)
Anesthesia Post Note  Patient: Margaret Farmer  Procedure(s) Performed: Release of left breast contracture (Left Breast) with fat filling (Left Breast)     Patient location during evaluation: PACU Anesthesia Type: General Level of consciousness: awake and alert and oriented Pain management: pain level controlled Vital Signs Assessment: post-procedure vital signs reviewed and stable Respiratory status: spontaneous breathing, nonlabored ventilation and respiratory function stable Cardiovascular status: blood pressure returned to baseline and stable Postop Assessment: no apparent nausea or vomiting Anesthetic complications: no   No complications documented.  Last Vitals:  Vitals:   10/24/20 1400 10/24/20 1415  BP: 109/65   Pulse: 78   Resp: 15 18  Temp:  36.5 C  SpO2: 92% 94%    Last Pain:  Vitals:   10/24/20 1415  TempSrc:   PainSc: 5                  Deshawnda Acrey A.

## 2020-10-24 NOTE — Transfer of Care (Signed)
Immediate Anesthesia Transfer of Care Note  Patient: Margaret Farmer  Procedure(s) Performed: Release of left breast contracture (Left Breast) with fat filling (Left Breast)  Patient Location: PACU  Anesthesia Type:General  Level of Consciousness: awake, alert  and oriented  Airway & Oxygen Therapy: Patient Spontanous Breathing and Patient connected to face mask oxygen  Post-op Assessment: Report given to RN and Post -op Vital signs reviewed and stable  Post vital signs: Reviewed and stable  Last Vitals:  Vitals Value Taken Time  BP    Temp    Pulse 84 10/24/20 1332  Resp 18 10/24/20 1332  SpO2 95 % 10/24/20 1332  Vitals shown include unvalidated device data.  Last Pain:  Vitals:   10/24/20 1026  TempSrc: Oral  PainSc: 0-No pain         Complications: No complications documented.

## 2020-10-24 NOTE — Op Note (Addendum)
DATE OF OPERATION: 10/24/2020  LOCATION: [facility]  PREOPERATIVE DIAGNOSIS: Contracture of left breast with asymmetry  POSTOPERATIVE DIAGNOSIS: Same  PROCEDURE: Release of left breast contracture 9 cm with lipofilling for symmetry 90 cc  SURGEON: Lyndee Leo Sanger Nakeyia Menden, DO  ASSISTANT: Phoebe Sharps, PA  EBL: 5 cc  CONDITION: Stable  COMPLICATIONS: None  INDICATION: The patient, Margaret Farmer, is a 65 y.o. female born on [birth year], is here for treatment of breast asymmetry after a left mastectomy.   PROCEDURE DETAILS:  The patient was seen prior to surgery and marked.  The IV antibiotics were given. The patient was taken to the operating room and given a general anesthetic. A standard time out was performed and all information was confirmed by those in the room. SCDs were placed.   The chest and abdomen were prepped and draped. Local with epinephrine was injected into the lateral left breast and the periumbilical scar.  The #15 blade was used to make a 3 mm incision.  The tumescent was infused 1000 cc into the fat layer of the abdomen.  After waiting for several minutes liposuction was performed.  The fat was collected in the Revolve and prepared according to the manufacture guidelines 300 cc.  The blade was then used to release the 9 cm of contracture of the left breast that was around the nipple areola.  The fat was the injected into the remaining space of the left breast (90 cc).  For correct contour the fat removed from the abdomen was 950 cc.  The incisions were closed with the 5-0 Monocryl.  Dressings were applied. The patient was allowed to wake up and taken to recovery room in stable condition at the end of the case. The family was notified at the end of the case.   The advanced practice practitioner (APP) assisted throughout the case.  The APP was essential in retraction and counter traction when needed to make the case progress smoothly.  This retraction and  assistance made it possible to see the tissue plans for the procedure.  The assistance was needed for blood control, tissue re-approximation and assisted with closure of the incision site.

## 2020-10-24 NOTE — Anesthesia Procedure Notes (Signed)
Procedure Name: LMA Insertion Date/Time: 10/24/2020 11:40 AM Performed by: Willa Frater, CRNA Pre-anesthesia Checklist: Patient identified, Emergency Drugs available, Suction available and Patient being monitored Patient Re-evaluated:Patient Re-evaluated prior to induction Oxygen Delivery Method: Circle system utilized Preoxygenation: Pre-oxygenation with 100% oxygen Induction Type: IV induction Ventilation: Mask ventilation without difficulty LMA: LMA inserted LMA Size: 4.0 Number of attempts: 1 Airway Equipment and Method: Bite block Placement Confirmation: positive ETCO2 Tube secured with: Tape Dental Injury: Teeth and Oropharynx as per pre-operative assessment

## 2020-10-24 NOTE — Anesthesia Preprocedure Evaluation (Addendum)
Anesthesia Evaluation  Patient identified by MRN, date of birth, ID band Patient awake    Reviewed: Allergy & Precautions, NPO status , Patient's Chart, lab work & pertinent test results  Airway Mallampati: II  TM Distance: >3 FB Neck ROM: Full    Dental no notable dental hx. (+) Teeth Intact   Pulmonary neg pulmonary ROS,    Pulmonary exam normal breath sounds clear to auscultation       Cardiovascular hypertension, Pt. 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 Hx/o left breast Ca  Renal/GU   negative genitourinary   Musculoskeletal negative musculoskeletal ROS (+)   Abdominal (+) - obese,   Peds  Hematology negative hematology ROS (+)   Anesthesia Other Findings   Reproductive/Obstetrics                             Anesthesia Physical Anesthesia Plan  ASA: II  Anesthesia Plan: General   Post-op Pain Management:    Induction:   PONV Risk Score and Plan: 4 or greater and Treatment may vary due to age or medical condition, Ondansetron, Dexamethasone and Midazolam  Airway Management Planned: LMA and Oral ETT  Additional Equipment:   Intra-op Plan:   Post-operative Plan: Extubation in OR  Informed Consent: I have reviewed the patients History and Physical, chart, labs and discussed the procedure including the risks, benefits and alternatives for the proposed anesthesia with the patient or authorized representative who has indicated his/her understanding and acceptance.     Dental advisory given  Plan Discussed with: CRNA and Anesthesiologist  Anesthesia Plan  Comments:        Anesthesia Quick Evaluation

## 2020-10-24 NOTE — Discharge Instructions (Signed)

## 2020-10-25 ENCOUNTER — Encounter (HOSPITAL_BASED_OUTPATIENT_CLINIC_OR_DEPARTMENT_OTHER): Payer: Self-pay | Admitting: Plastic Surgery

## 2020-10-31 ENCOUNTER — Encounter: Payer: Medicare Other | Admitting: Surgical

## 2020-11-02 ENCOUNTER — Encounter: Payer: Medicare Other | Admitting: Plastic Surgery

## 2020-11-05 ENCOUNTER — Other Ambulatory Visit: Payer: Self-pay | Admitting: Plastic Surgery

## 2020-11-05 ENCOUNTER — Telehealth: Payer: Self-pay | Admitting: Plastic Surgery

## 2020-11-05 MED ORDER — KETOROLAC TROMETHAMINE 10 MG PO TABS: 10.0000 mg | ORAL_TABLET | Freq: Four times a day (QID) | ORAL | 0 refills | Status: DC | PRN

## 2020-11-05 MED FILL — AMLODIPINE BESYLATE 5 MG TA: 5 | 30 days supply | Qty: 30 | Fill #3

## 2020-11-05 NOTE — Telephone Encounter (Signed)
Spoke with the patient and informed her that Dr. Marla Roe sent in another type of pain medication for her.  Patient verbalized understanding and agreed.//AB/CMA

## 2020-11-05 NOTE — Telephone Encounter (Signed)
Called and [employer] @ 11:01am) asking the patient to RTC regarding the message below.//AB/CMA

## 2020-11-05 NOTE — Telephone Encounter (Signed)
Patient called to request if she could have more pain medication. Sx was a week and a half ago and she only received enough for a week. She requested the prescription be called into [facility] on [address] in [place]. Please call patient to advise.

## 2020-11-06 ENCOUNTER — Telehealth: Payer: Self-pay | Admitting: Plastic Surgery

## 2020-11-06 MED ORDER — DICLOFENAC POTASSIUM 50 MG PO TABS: 50.0000 mg | ORAL_TABLET | Freq: Three times a day (TID) | ORAL | 0 refills | Status: AC

## 2020-11-06 MED FILL — SPIRONOLACTONE 25 MG TABS: 25 | 90 days supply | Qty: 90 | Fill #3

## 2020-11-06 NOTE — Telephone Encounter (Signed)
Toradol denied by insurance. Cataflam sent in.

## 2020-11-12 ENCOUNTER — Telehealth: Payer: Self-pay | Admitting: *Deleted

## 2020-11-12 NOTE — Telephone Encounter (Signed)
Received Prior Authorization request from pharmacy on (11/05/20) for Ketorolac Tromethamine 10mg .    Received from pharmacy that the patient's insurance prefers for the patient to [id number] or Diclofenac.  Per Dr. Levonne Lapping rx to Diclofenac.  Call pharmacy and informed them to change rx to Diclofenac.    Patient aware of message and agreed.//AB/CMA

## 2020-11-20 ENCOUNTER — Other Ambulatory Visit (HOSPITAL_COMMUNITY): Payer: Self-pay | Admitting: Internal Medicine

## 2020-11-23 ENCOUNTER — Other Ambulatory Visit: Payer: Self-pay

## 2020-11-23 ENCOUNTER — Ambulatory Visit (INDEPENDENT_AMBULATORY_CARE_PROVIDER_SITE_OTHER): Payer: Medicare Other | Admitting: Plastic Surgery

## 2020-11-23 ENCOUNTER — Encounter: Payer: Self-pay | Admitting: Plastic Surgery

## 2020-11-23 VITALS — BP 170/100 | HR 110

## 2020-11-23 DIAGNOSIS — N651 Disproportion of reconstructed breast: Secondary | ICD-10-CM

## 2020-11-23 NOTE — Progress Notes (Signed)
The patient is a 66 year old female here for follow-up on her surgery.  She underwent fat grafting.  She was initially concerned she had a hematoma because it was firm.  On the left breast she does not have a hematoma and the fat that we grafted appears to be doing very well it is firm but we did a lot of grafting.  Her abdomen still has some swelling and bruising but overall she is doing really well.  Recommend gauze and Vaseline to the abdomen and start massaging the left breast.  Follow-up in 2 weeks.

## 2020-12-04 ENCOUNTER — Other Ambulatory Visit (HOSPITAL_BASED_OUTPATIENT_CLINIC_OR_DEPARTMENT_OTHER): Payer: Self-pay

## 2020-12-05 MED FILL — AMLODIPINE BESYLATE 5 MG TA: 5 | 30 days supply | Qty: 30 | Fill #4

## 2020-12-05 MED FILL — SYNTHROID 137 MCG TABLET: 137 | 90 days supply | Qty: 90 | Fill #2

## 2020-12-15 ENCOUNTER — Other Ambulatory Visit (HOSPITAL_COMMUNITY): Payer: Self-pay

## 2020-12-15 MED FILL — Metoprolol Tartrate Tab 25 MG: ORAL | 30 days supply | Qty: 60 | Fill #0 | Status: AC

## 2020-12-16 ENCOUNTER — Other Ambulatory Visit (HOSPITAL_COMMUNITY): Payer: Self-pay

## 2020-12-18 ENCOUNTER — Other Ambulatory Visit (HOSPITAL_COMMUNITY): Payer: Self-pay

## 2020-12-20 ENCOUNTER — Other Ambulatory Visit (HOSPITAL_COMMUNITY): Payer: Self-pay

## 2021-01-29 ENCOUNTER — Encounter: Payer: Self-pay | Admitting: Plastic Surgery

## 2021-01-30 NOTE — Telephone Encounter (Signed)
See msg

## 2021-02-13 ENCOUNTER — Other Ambulatory Visit (HOSPITAL_COMMUNITY): Payer: Self-pay

## 2021-02-13 MED FILL — Metoprolol Tartrate Tab 25 MG: ORAL | 30 days supply | Qty: 60 | Fill #1 | Status: AC

## 2021-02-13 MED FILL — Amlodipine Besylate Tab 5 MG (Base Equivalent): ORAL | 30 days supply | Qty: 30 | Fill #0 | Status: AC

## 2021-02-28 ENCOUNTER — Other Ambulatory Visit: Payer: Self-pay

## 2021-02-28 MED FILL — Fluticasone Propionate Nasal Susp 50 MCG/ACT: NASAL | 30 days supply | Qty: 16 | Fill #0 | Status: CN

## 2021-03-18 ENCOUNTER — Other Ambulatory Visit (HOSPITAL_COMMUNITY): Payer: Self-pay

## 2021-03-18 MED FILL — Metoprolol Tartrate Tab 25 MG: ORAL | 30 days supply | Qty: 60 | Fill #2 | Status: AC

## 2021-03-18 MED FILL — Amlodipine Besylate Tab 5 MG (Base Equivalent): ORAL | 30 days supply | Qty: 30 | Fill #1 | Status: AC

## 2021-03-19 ENCOUNTER — Other Ambulatory Visit (HOSPITAL_COMMUNITY): Payer: Self-pay

## 2021-03-19 ENCOUNTER — Other Ambulatory Visit: Payer: Self-pay

## 2021-03-25 ENCOUNTER — Other Ambulatory Visit: Payer: Self-pay

## 2021-03-25 MED FILL — Sumatriptan Succinate Tab 50 MG: ORAL | 20 days supply | Qty: 10 | Fill #0 | Status: CN

## 2021-03-25 MED FILL — Fluticasone Propionate Nasal Susp 50 MCG/ACT: NASAL | 30 days supply | Qty: 16 | Fill #0 | Status: CN

## 2021-04-08 ENCOUNTER — Other Ambulatory Visit: Payer: Self-pay

## 2021-04-23 ENCOUNTER — Other Ambulatory Visit (HOSPITAL_COMMUNITY): Payer: Self-pay

## 2021-04-23 MED FILL — Amlodipine Besylate Tab 5 MG (Base Equivalent): ORAL | 30 days supply | Qty: 30 | Fill #2 | Status: AC

## 2021-04-24 ENCOUNTER — Other Ambulatory Visit (HOSPITAL_COMMUNITY): Payer: Self-pay

## 2021-04-25 ENCOUNTER — Other Ambulatory Visit (HOSPITAL_COMMUNITY): Payer: Self-pay

## 2021-04-25 MED ORDER — METOPROLOL TARTRATE 25 MG PO TABS
25.0000 mg | ORAL_TABLET | Freq: Two times a day (BID) | ORAL | 3 refills | Status: AC
  Filled 2021-04-25: qty 180, 90d supply, fill #0

## 2021-04-27 ENCOUNTER — Other Ambulatory Visit (HOSPITAL_COMMUNITY): Payer: Self-pay

## 2021-05-18 ENCOUNTER — Other Ambulatory Visit (HOSPITAL_COMMUNITY): Payer: Self-pay

## 2021-05-18 MED FILL — Amlodipine Besylate Tab 5 MG (Base Equivalent): ORAL | 30 days supply | Qty: 30 | Fill #3 | Status: CN

## 2021-05-27 ENCOUNTER — Other Ambulatory Visit (HOSPITAL_COMMUNITY): Payer: Self-pay

## 2021-07-18 ENCOUNTER — Other Ambulatory Visit (HOSPITAL_COMMUNITY): Payer: Self-pay

## 2021-07-18 MED ORDER — AMLODIPINE BESYLATE 5 MG PO TABS
ORAL_TABLET | ORAL | 3 refills | Status: AC
  Filled 2021-07-18: qty 90, 90d supply, fill #0

## 2021-09-23 ENCOUNTER — Other Ambulatory Visit (HOSPITAL_COMMUNITY): Payer: Self-pay

## 2021-10-09 ENCOUNTER — Other Ambulatory Visit: Payer: Self-pay

## 2021-12-02 ENCOUNTER — Other Ambulatory Visit: Payer: Self-pay | Admitting: Internal Medicine

## 2021-12-02 DIAGNOSIS — Z1231 Encounter for screening mammogram for malignant neoplasm of breast: Secondary | ICD-10-CM

## 2022-01-09 ENCOUNTER — Ambulatory Visit
Admission: RE | Admit: 2022-01-09 | Discharge: 2022-01-09 | Disposition: A | Discharge status: Home / Self Care | Payer: Medicare Other | Source: Ambulatory Visit | Attending: Internal Medicine | Admitting: Internal Medicine

## 2022-01-09 DIAGNOSIS — Z1231 Encounter for screening mammogram for malignant neoplasm of breast: Secondary | ICD-10-CM | POA: Diagnosis present

## 2022-04-04 ENCOUNTER — Other Ambulatory Visit: Payer: Self-pay

## 2022-04-04 MED ORDER — CETIRIZINE HCL 10 MG PO TABS
ORAL_TABLET | ORAL | 10 refills | Status: AC
Start: 2022-04-04 — End: ?

## 2022-04-04 MED ORDER — FLUTICASONE PROPIONATE 50 MCG/ACT NA SUSP
NASAL | 11 refills | Status: AC
  Filled 2022-04-04: qty 16, 30d supply, fill #0

## 2022-04-22 ENCOUNTER — Other Ambulatory Visit: Payer: Self-pay

## 2022-05-02 ENCOUNTER — Other Ambulatory Visit: Payer: Self-pay

## 2022-05-02 MED ORDER — EPINEPHRINE 0.3 MG/0.3ML IJ SOAJ
INTRAMUSCULAR | 0 refills | Status: AC
Start: 2022-05-02 — End: ?
  Filled 2022-05-02: qty 2, 30d supply, fill #0

## 2023-02-10 ENCOUNTER — Other Ambulatory Visit: Payer: Self-pay | Admitting: Internal Medicine

## 2023-02-10 DIAGNOSIS — Z1231 Encounter for screening mammogram for malignant neoplasm of breast: Secondary | ICD-10-CM

## 2023-02-17 ENCOUNTER — Ambulatory Visit: Payer: Medicare Other

## 2023-04-08 ENCOUNTER — Ambulatory Visit
Admission: RE | Admit: 2023-04-08 | Discharge: 2023-04-08 | Disposition: A | Discharge status: Home / Self Care | Payer: Medicare Other | Source: Ambulatory Visit | Attending: Internal Medicine | Admitting: Internal Medicine

## 2023-04-08 DIAGNOSIS — Z1231 Encounter for screening mammogram for malignant neoplasm of breast: Secondary | ICD-10-CM | POA: Insufficient documentation

## 2024-03-21 LAB — COLOGUARD: COLOGUARD: NEGATIVE

## 2024-04-18 ENCOUNTER — Encounter: Payer: Self-pay | Admitting: Nurse Practitioner

## 2024-04-18 ENCOUNTER — Other Ambulatory Visit: Payer: Self-pay | Admitting: Internal Medicine

## 2024-04-18 ENCOUNTER — Encounter: Payer: Self-pay | Admitting: Internal Medicine

## 2024-04-18 DIAGNOSIS — Z1231 Encounter for screening mammogram for malignant neoplasm of breast: Secondary | ICD-10-CM

## 2024-04-20 ENCOUNTER — Other Ambulatory Visit: Payer: Self-pay | Admitting: Internal Medicine

## 2024-04-20 ENCOUNTER — Encounter: Payer: Self-pay | Admitting: Internal Medicine

## 2024-04-20 DIAGNOSIS — N6459 Other signs and symptoms in breast: Secondary | ICD-10-CM

## 2024-04-20 DIAGNOSIS — Z853 Personal history of malignant neoplasm of breast: Secondary | ICD-10-CM

## 2024-04-22 ENCOUNTER — Ambulatory Visit
Admission: RE | Admit: 2024-04-22 | Discharge: 2024-04-22 | Disposition: A | Discharge status: Home / Self Care | Source: Ambulatory Visit | Attending: Internal Medicine | Admitting: Internal Medicine

## 2024-04-22 DIAGNOSIS — N6459 Other signs and symptoms in breast: Secondary | ICD-10-CM

## 2024-04-22 DIAGNOSIS — Z853 Personal history of malignant neoplasm of breast: Secondary | ICD-10-CM

## 2024-06-03 ENCOUNTER — Other Ambulatory Visit: Payer: Self-pay

## 2024-09-16 ENCOUNTER — Other Ambulatory Visit: Payer: Self-pay | Admitting: Internal Medicine

## 2024-09-16 DIAGNOSIS — D0512 Intraductal carcinoma in situ of left breast: Secondary | ICD-10-CM

## 2024-09-16 DIAGNOSIS — R2231 Localized swelling, mass and lump, right upper limb: Secondary | ICD-10-CM

## 2024-09-16 DIAGNOSIS — I10 Essential (primary) hypertension: Secondary | ICD-10-CM

## 2024-09-23 ENCOUNTER — Ambulatory Visit
Admission: RE | Admit: 2024-09-23 | Discharge: 2024-09-23 | Disposition: A | Source: Ambulatory Visit | Attending: Internal Medicine | Admitting: Internal Medicine

## 2024-09-23 DIAGNOSIS — D0512 Intraductal carcinoma in situ of left breast: Secondary | ICD-10-CM | POA: Diagnosis present

## 2024-09-23 DIAGNOSIS — R2231 Localized swelling, mass and lump, right upper limb: Secondary | ICD-10-CM | POA: Diagnosis present

## 2024-09-23 DIAGNOSIS — I10 Essential (primary) hypertension: Secondary | ICD-10-CM | POA: Diagnosis present

## 2024-09-23 MED ORDER — IOHEXOL 300 MG/ML  SOLN
100.0000 mL | Freq: Once | INTRAMUSCULAR | Status: AC | PRN
  Administered 2024-09-23: 100 mL via INTRAVENOUS
